# Patient Record
Sex: Female | Born: 1935 | Race: White | Hispanic: No | Marital: Married | Smoking: Never smoker
Health system: Southern US, Community
[De-identification: ages and names within clinical notes are randomized; demographics above are authoritative.]

## PROBLEM LIST (undated history)

## (undated) DIAGNOSIS — E119 Type 2 diabetes mellitus without complications: Secondary | ICD-10-CM

## (undated) DIAGNOSIS — T753XXA Motion sickness, initial encounter: Secondary | ICD-10-CM

## (undated) DIAGNOSIS — E785 Hyperlipidemia, unspecified: Secondary | ICD-10-CM

## (undated) DIAGNOSIS — M1990 Unspecified osteoarthritis, unspecified site: Secondary | ICD-10-CM

## (undated) DIAGNOSIS — H9190 Unspecified hearing loss, unspecified ear: Secondary | ICD-10-CM

## (undated) DIAGNOSIS — E039 Hypothyroidism, unspecified: Secondary | ICD-10-CM

## (undated) DIAGNOSIS — K219 Gastro-esophageal reflux disease without esophagitis: Secondary | ICD-10-CM

## (undated) HISTORY — PX: ROTATOR CUFF REPAIR: SHX139

## (undated) HISTORY — DX: Hyperlipidemia, unspecified: E78.5

---
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
## 1974-07-04 DIAGNOSIS — C50919 Malignant neoplasm of unspecified site of unspecified female breast: Secondary | ICD-10-CM

## 1974-07-04 HISTORY — DX: Malignant neoplasm of unspecified site of unspecified female breast: C50.919

## 1976-07-04 HISTORY — PX: BREAST RECONSTRUCTION: SHX9

## 1976-07-04 HISTORY — PX: MASTECTOMY: SHX3

## 1978-07-04 HISTORY — PX: CYST EXCISION: SHX5701

## 1978-07-04 HISTORY — PX: THYROIDECTOMY: SHX17

## 2005-11-30 ENCOUNTER — Ambulatory Visit: Payer: Self-pay | Admitting: Unknown Physician Specialty

## 2006-03-14 ENCOUNTER — Ambulatory Visit: Payer: Self-pay

## 2008-09-23 ENCOUNTER — Ambulatory Visit: Payer: Self-pay | Admitting: Otolaryngology

## 2009-10-26 ENCOUNTER — Ambulatory Visit: Payer: Self-pay | Admitting: Family Medicine

## 2010-02-07 IMAGING — CT CT NECK WITH CONTRAST
1 of 2 series · 9 of 14 positions shown, 12 images · non-contrast
Comparison: none

REASON FOR EXAM: Left Anterior Fullness
COMMENTS:

[Series 2: soft tissue · axial · 0.45mm/px · z∈[-328,-157]mm · 9 of 73 slices shown, 12 images]
[im 8/73  soft-tissue]
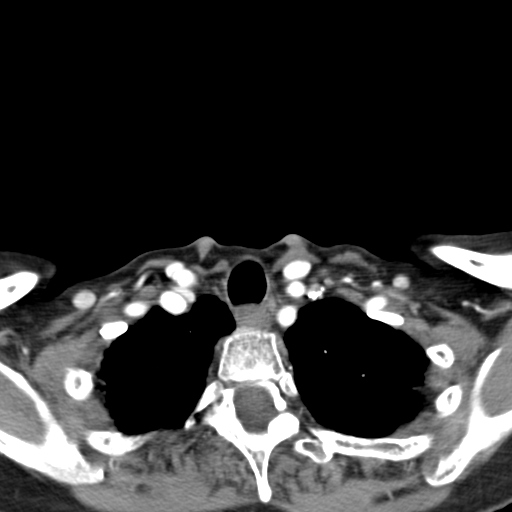
[im 8/73  bone]
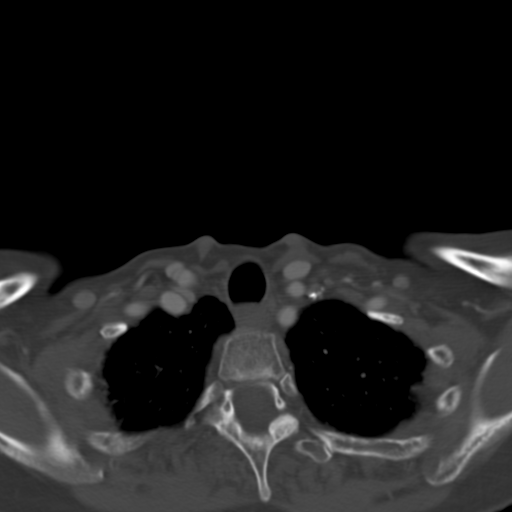
[im 15/73  bone]
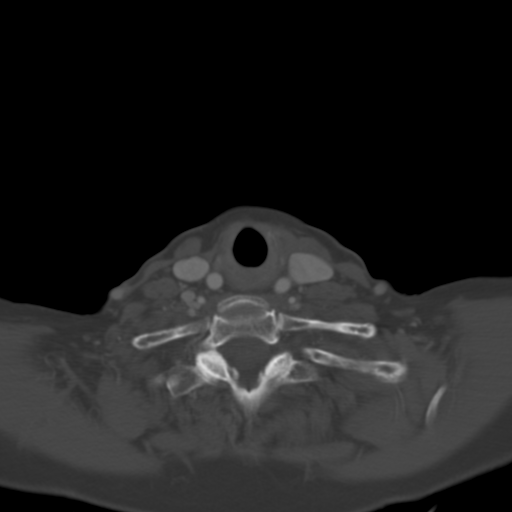
[im 22/73  bone]
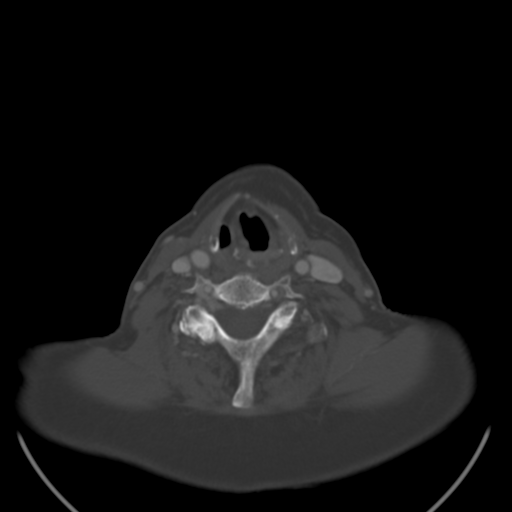
[im 29/73  bone]
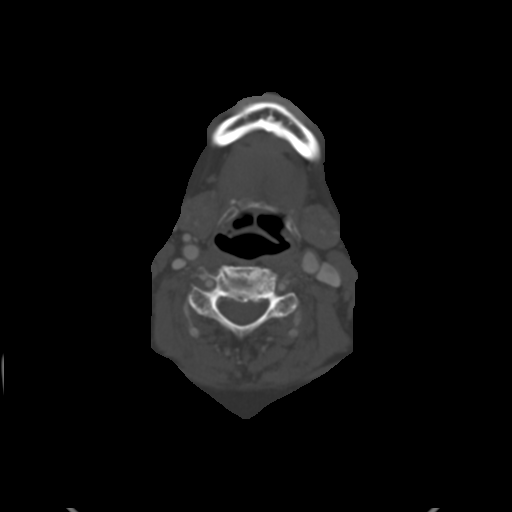
[im 37/73  soft-tissue]
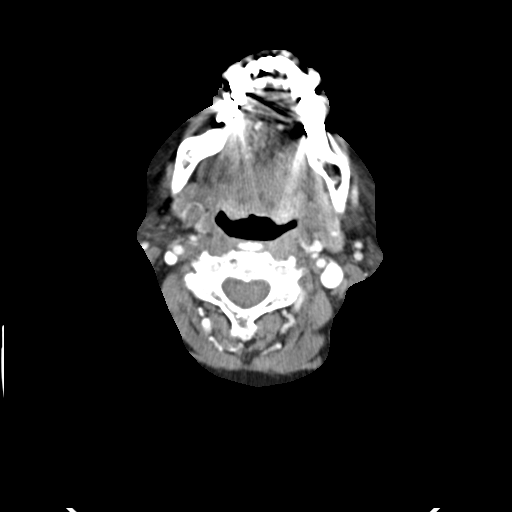
[im 37/73  bone]
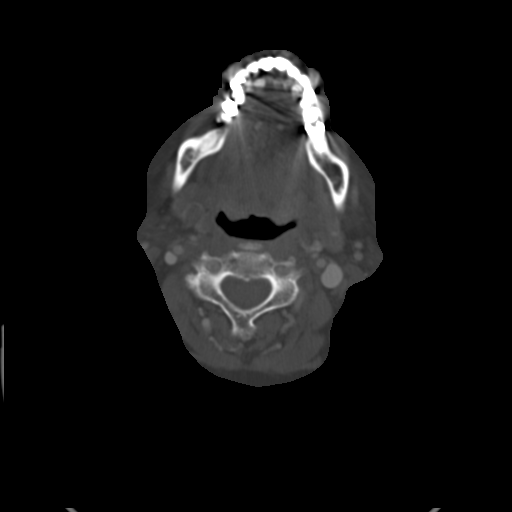
[im 44/73  bone]
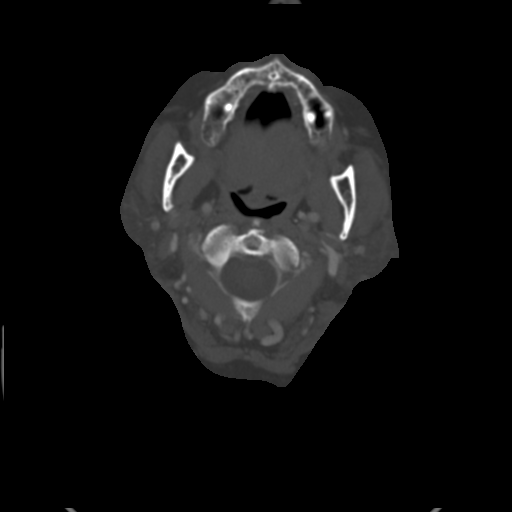
[im 51/73  bone]
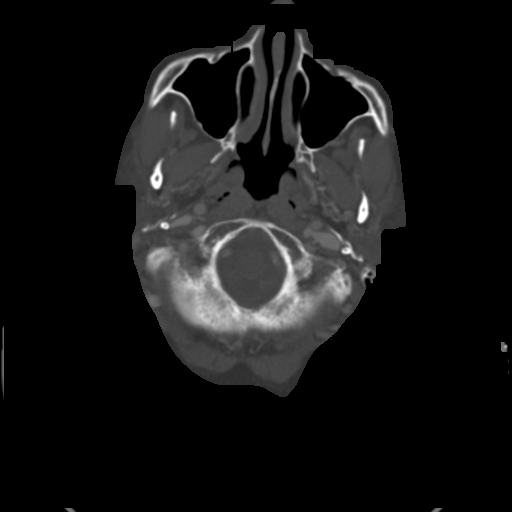
[im 58/73  bone]
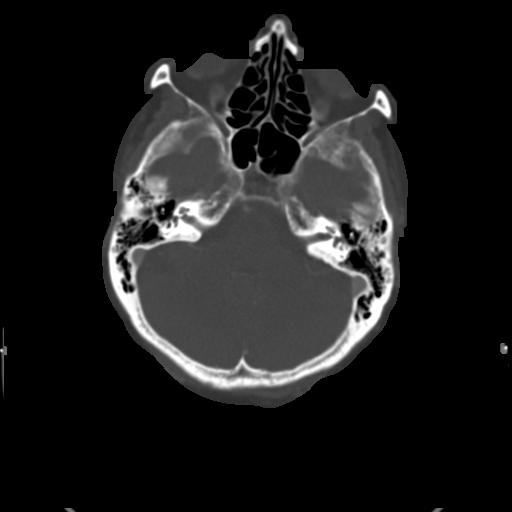
[im 65/73  soft-tissue]
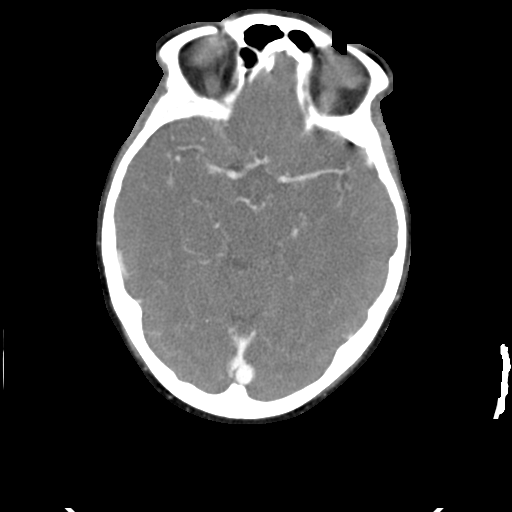
[im 65/73  bone]
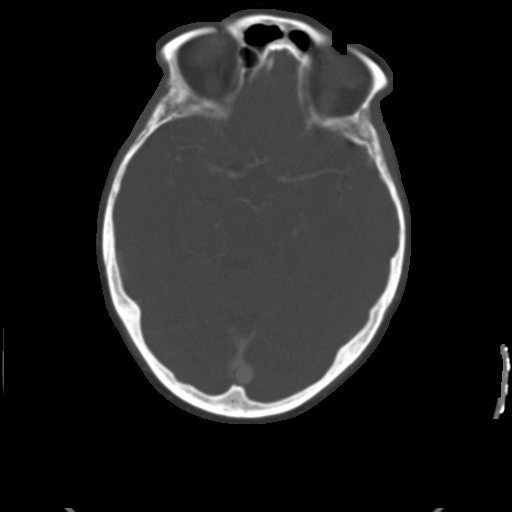

[9 of 14 positions shown; findings below may reference images not displayed]

PROCEDURE:     CT  - CT NECK WITH CONTRAST  - September 23, 2008  [DATE]

RESULT:     Axial CT scanning was performed through the neck at 3 mm
intervals and slice thicknesses following intravenous administration of 70
cc of Qsovue-NIQ. Review of 3-dimensional reconstructed images was performed
separately on the [employer] monitor. The patient is describing
fullness in the anterior aspect of the neck on the left.

The parotid and submandibular glands are normal in density and symmetric in
size. The subcutaneous and deeper soft tissues of the anterior neck appear
normal as well. I see no pathologic sized lymph nodes. On image 40 there is
a subcentimeter lymph node anterior to the right internal jugular vein and
common carotid artery. The sternocleidomastoid musculature appears normal in
density and contour. However, on the left the midportion of the
sternocleidomastoid muscle is slightly more prominent at the level of the
left thyroid bed anterior to the internal jugular vein. This is seen best on
images 59 through 66.

The laryngeal structures exhibit no acute abnormality. The left thyroid lobe
is tiny likely due to prior surgery. On the right the thyroid lobe is more
normal in appearance. The observed portions of the paranasal sinuses are
clear. The oropharyngeal structures and nasopharyngeal structures are within
the limits of normal. There are degenerative disc changes at multiple levels
of the cervical spine. There is mild apical pleural scarring in both upper
lobes.
IMPRESSION: 1. I do not see evidence of a subcutaneous or deeper mass in the anterior
soft tissues of the neck on the left. Very mild asymmetric fullness of the
left sternocleidomastoid muscle as compared to the right sternocleidomastoid
muscle is seen in the mid to lower neck which may be responsible for the
left neck fullness clinically.
2. The parotid and submandibular glands are normal in appearance. I see no
pathologic sized cervical lymphadenopathy.
3. The left thyroid lobe appears atrophic. The right thyroid lobe is more
normal in appearance. No laryngeal abnormalities are identified.

## 2011-02-09 ENCOUNTER — Ambulatory Visit: Payer: Self-pay | Admitting: Unknown Physician Specialty

## 2012-08-07 ENCOUNTER — Ambulatory Visit: Payer: Self-pay | Admitting: Unknown Physician Specialty

## 2012-08-08 LAB — PATHOLOGY REPORT

## 2012-08-10 ENCOUNTER — Ambulatory Visit: Payer: Self-pay | Admitting: Unknown Physician Specialty

## 2013-09-16 LAB — HM COLONOSCOPY

## 2013-12-02 ENCOUNTER — Ambulatory Visit: Payer: Self-pay | Admitting: Orthopedic Surgery

## 2013-12-18 ENCOUNTER — Ambulatory Visit: Payer: Self-pay | Admitting: Orthopedic Surgery

## 2013-12-18 LAB — CBC
HCT: 41.4 % (ref 35.0–47.0)
HGB: 13.7 g/dL (ref 12.0–16.0)
MCH: 32.5 pg (ref 26.0–34.0)
MCHC: 33.2 g/dL (ref 32.0–36.0)
MCV: 98 fL (ref 80–100)
PLATELETS: 232 10*3/uL (ref 150–440)
RBC: 4.23 10*6/uL (ref 3.80–5.20)
RDW: 13.4 % (ref 11.5–14.5)
WBC: 9.7 10*3/uL (ref 3.6–11.0)

## 2013-12-18 LAB — URINALYSIS, COMPLETE
BILIRUBIN, UR: NEGATIVE
Bacteria: NONE SEEN
Blood: NEGATIVE
Glucose,UR: NEGATIVE mg/dL (ref 0–75)
Ketone: NEGATIVE
Leukocyte Esterase: NEGATIVE
Nitrite: NEGATIVE
PROTEIN: NEGATIVE
Ph: 6 (ref 4.5–8.0)
RBC,UR: 1 /HPF (ref 0–5)
Specific Gravity: 1.011 (ref 1.003–1.030)
Squamous Epithelial: NONE SEEN

## 2013-12-18 LAB — BASIC METABOLIC PANEL
Anion Gap: 6 — ABNORMAL LOW (ref 7–16)
BUN: 8 mg/dL (ref 7–18)
CREATININE: 0.67 mg/dL (ref 0.60–1.30)
Calcium, Total: 8.7 mg/dL (ref 8.5–10.1)
Chloride: 104 mmol/L (ref 98–107)
Co2: 30 mmol/L (ref 21–32)
EGFR (African American): >60
EGFR (Non-African Amer.): >60
Glucose: 174 mg/dL — ABNORMAL HIGH (ref 65–99)
Osmolality: 282 (ref 275–301)
Potassium: 3.4 mmol/L — ABNORMAL LOW (ref 3.5–5.1)
Sodium: 140 mmol/L (ref 136–145)

## 2013-12-18 LAB — PROTIME-INR
INR: 1
Prothrombin Time: 12.6 secs (ref 11.5–14.7)

## 2013-12-18 LAB — APTT: ACTIVATED PTT: 26.4 s (ref 23.6–35.9)

## 2013-12-25 IMAGING — RF DG UGI W/ KUB
1 series · 15 of 24 positions shown · non-contrast
Comparison: None

REASON FOR EXAM: reflux mildly asymmetrical hiatal hernia
COMMENTS:

PROCEDURE:     FL  - FL UPPER GI W/ BARIUM SWALLOW  - August 10, 2012  [DATE]
RESULT:     Indication: Hiatal hernia

[Series 1: run · 22 acquisitions, 15 frames shown]
[im 1/22]
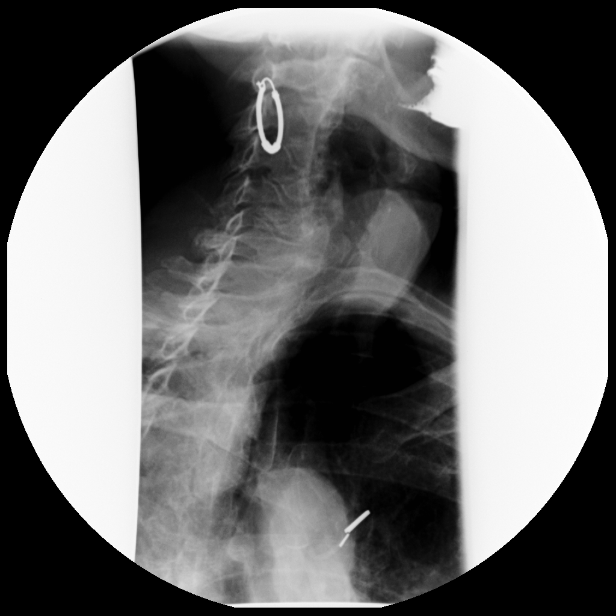
[im 1/22]
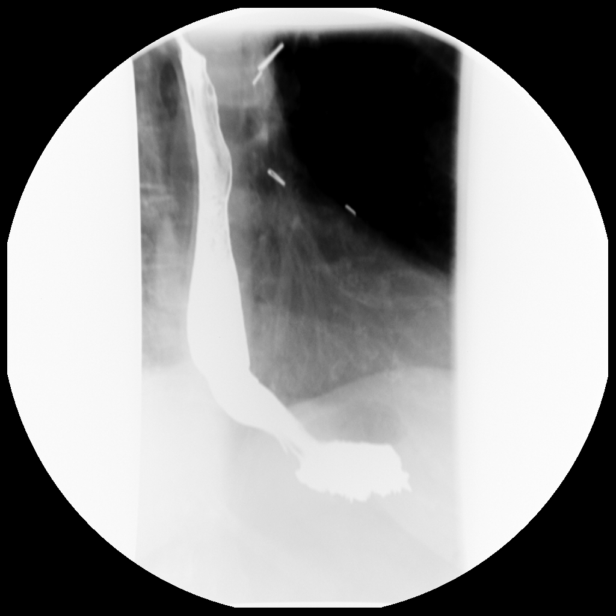
[im 2/22]
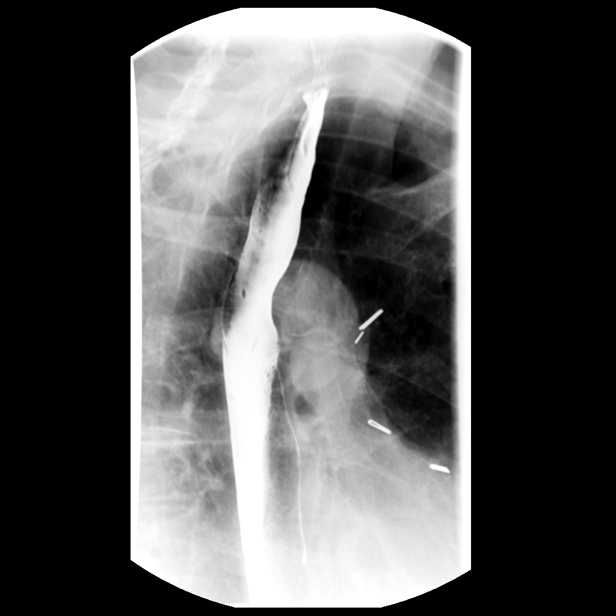
[im 3/22]
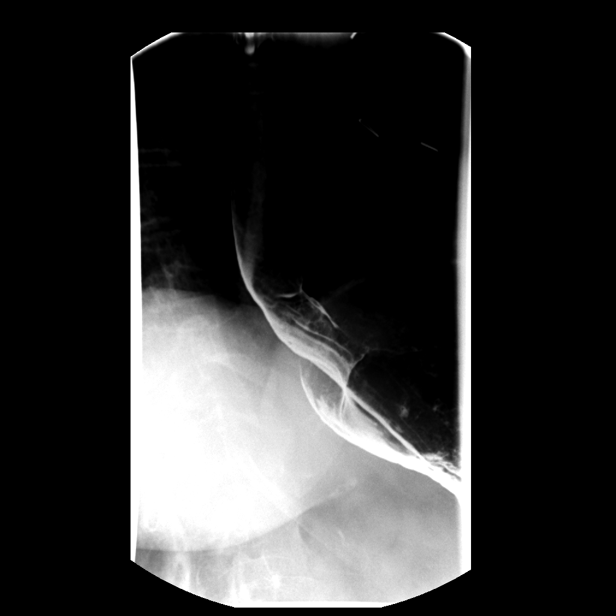
[im 4/22]
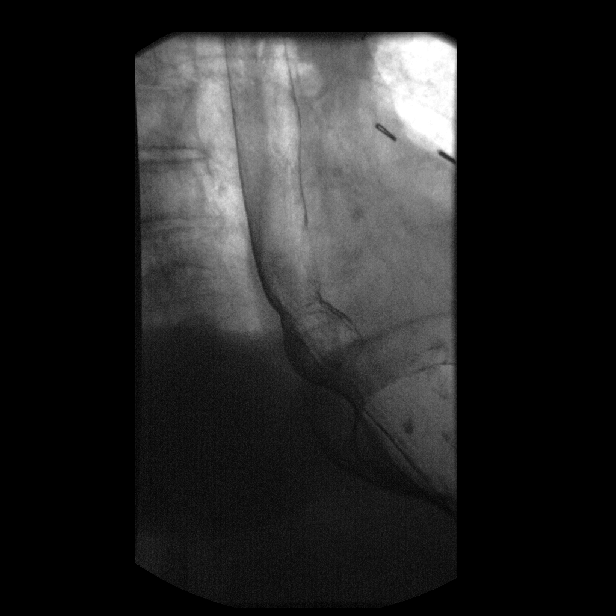
[im 5/22]
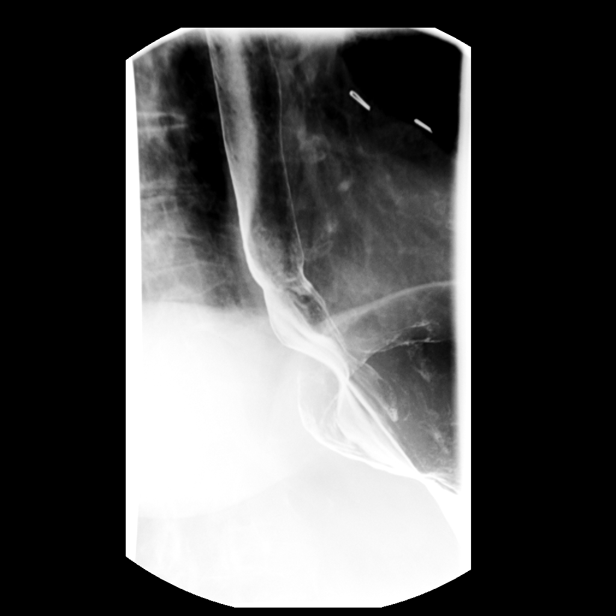
[im 6/22]
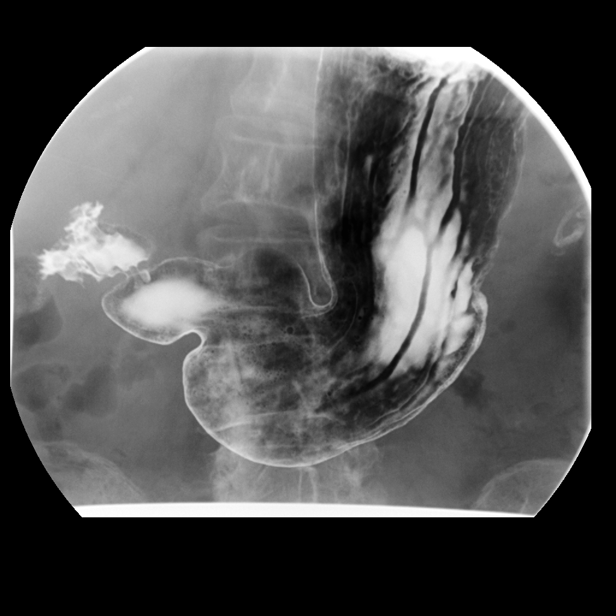
[im 9/22]
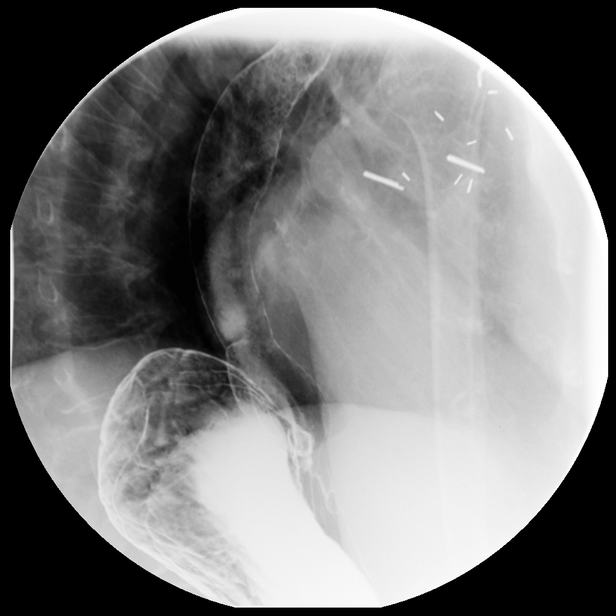
[im 11/22]
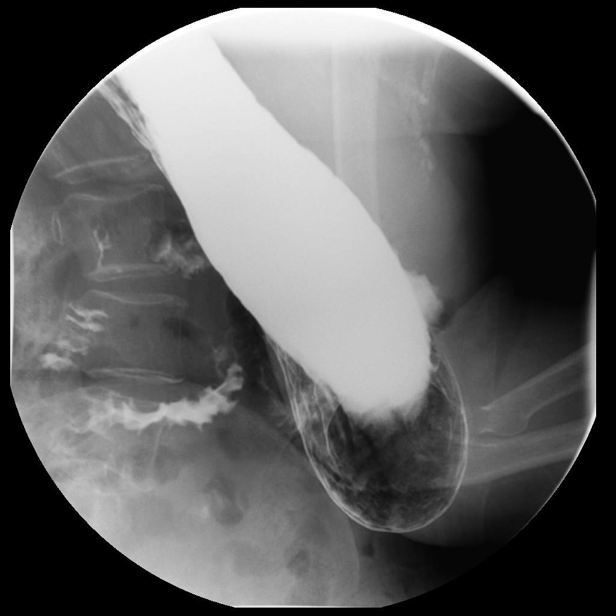
[im 14/22]
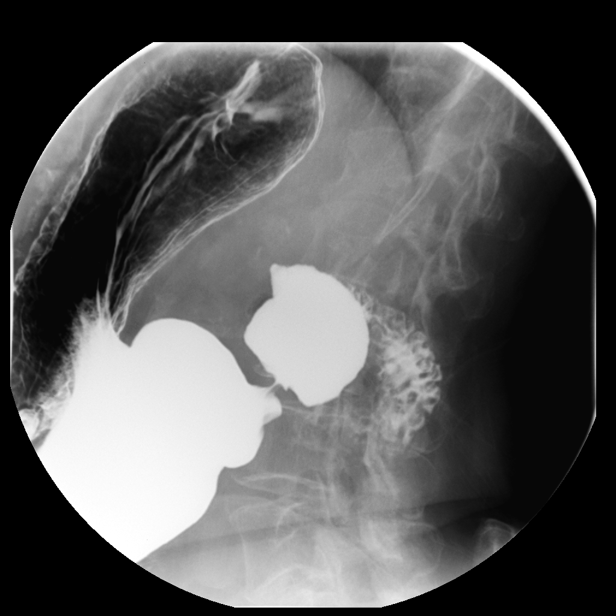
[im 16/22]
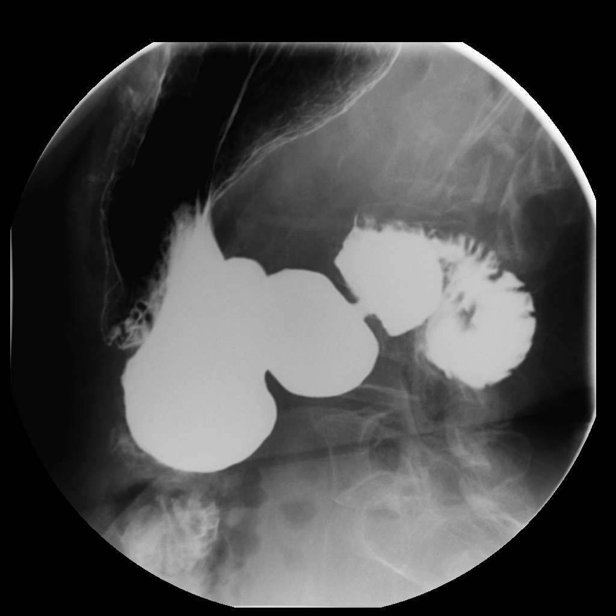
[im 20/22]
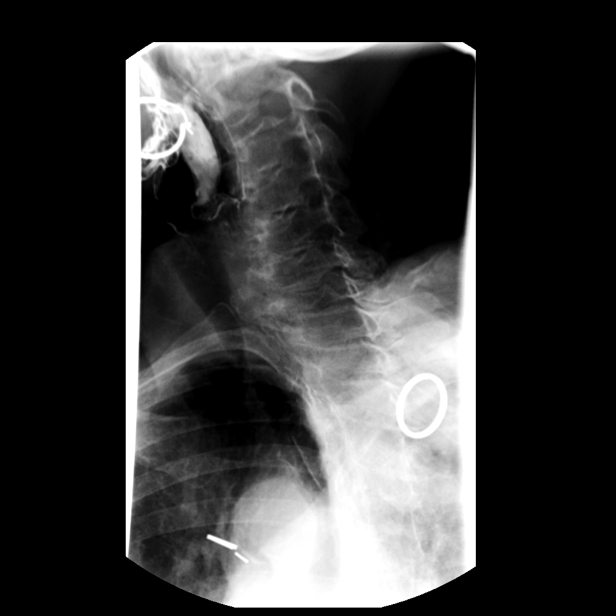
[im 20/22]
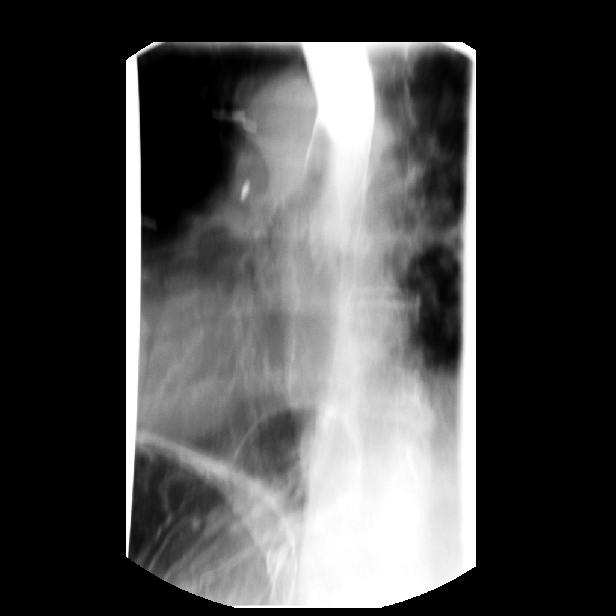
[im 21/22]
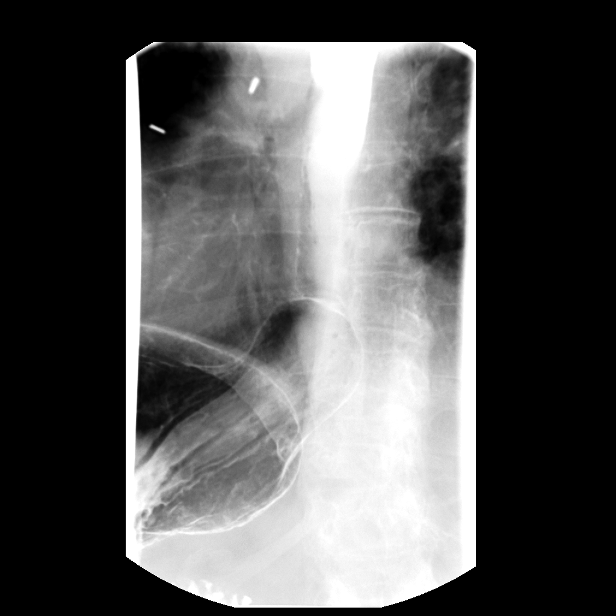
[im 22/22]
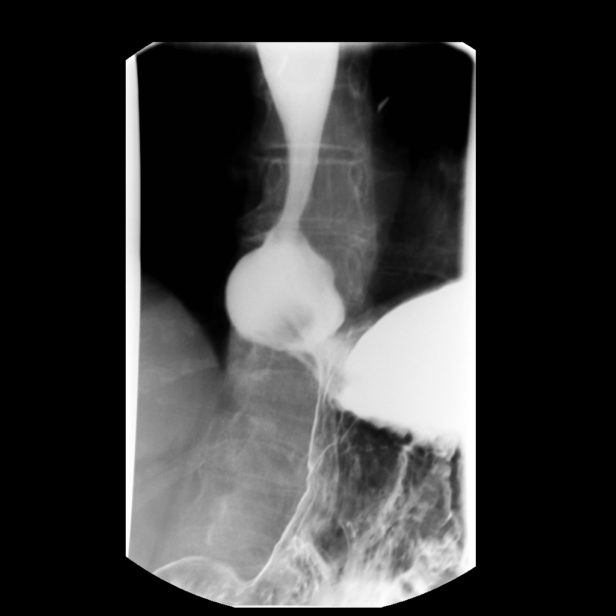

[15 of 24 positions shown; findings below may reference images not displayed]

FINDINGS: Biphasic examination of the esophagus to the distal duodenum was performed
without complication. Total fluoroscopy time was 1.2 minutes.

Examination of the esophagus demonstrated normal esophageal motility. Normal
esophageal morphology without evidence of esophagitis or ulceration. No
esophageal stricture, diverticula, or mass lesion. There is a large hiatal
hernia which reduces in the upright position. There is spontaneous
gastroesophageal reflux.

Examination of the stomach demonstrated normal rugal folds and areae
gastricae. The gastric mucosa appeared unremarkable without evidence of
ulceration, scarring, or mass lesion. Gastric motility and emptying was
normal. Fluoroscopic examination of the duodenum demonstrates normal
motility and morphology without evidence of ulceration or mass lesion.
IMPRESSION: 1. There is a large hiatal hernia which reduces in the upright position.

2. Spontaneous gastroesophageal reflux.

[REDACTED]

## 2014-01-02 ENCOUNTER — Ambulatory Visit: Payer: Self-pay | Admitting: Orthopedic Surgery

## 2014-10-25 NOTE — Op Note (Signed)
PATIENT NAME:  Erin Cook, Erin Cook MR#:  [id number] DATE OF BIRTH:  1936-03-20  DATE OF PROCEDURE:  01/02/2014  PREOPERATIVE DIAGNOSES: Right shoulder rotator cuff tear, subacromial impingement, and acromioclavicular joint arthrosis.   POSTOPERATIVE DIAGNOSES:  Right shoulder rotator cuff tear, subacromial impingement, and acromioclavicular joint arthrosis.  PROCEDURE: Right shoulder arthroscopic subacromial decompression, distal clavicle excision, and mini open rotator cuff repair.   ANESTHESIA: General with right interscalene block with local injection with 1% lidocaine plain and 0.25% Marcaine plain.   SURGEON: [place], M.D.   ASSISTANT: Cameron Proud, [employer] student.   ESTIMATED BLOOD LOSS: Minimal.   COMPLICATIONS: None.   INDICATIONS FOR THE PROCEDURE: Patient is a 79 year old female who has had persistent pain for several months in the right shoulder, particularly with abduction activity. This began after an injury. The patient has not responded to nonoperative management including nonsteroidal anti-inflammatories, corticosteroid injection, and physical therapy. She has an MRI showing a high-grade partial-thickness tear to the supraspinatus. She has opted for surgical repair. I reviewed the risks and benefits of surgery with her prior to the date of her procedure in my office. She understands the risks include, but are not limited to, deep vein thrombosis and pulmonary embolism, myocardial infarction, stroke, pneumonia, respiratory failure, and death. These surgical risks include infection, bleeding, nerve or blood vessel injury, persistent shoulder pain, hardware failure, retear of the rotator cuff, persistent pain or limitation, and the need for further surgery. The patient understood these risks and signed consent form.   PROCEDURE NOTE: The patient was met in the preoperative area. I marked the right shoulder with the word "yes" according to the hospital's right site protocol  after verbally confirming with her that this was the correct site of surgery. She had an interscalene block placed by the anesthesia service in the preoperative area. She was then brought to the Operating Room where she was placed in a beach chair position. A spider arm holder was used for this case. All bony prominences were adequately padded for this case. The patient received 2 grams of Kefzol prior to the onset of the case. She was prepped and draped in a sterile fashion. A timeout was performed to verify the patient's name, date of birth, medical record number, correct site of surgery and correct procedure to be performed. It was also used to verify the patient had received antibiotics and that all appropriate instruments, implants, and radiographic studies were available in the room. Once all in attendance were in agreement, the case began.   The patient had her bony landmarks drawn out with a surgical marker along with proposed arthroscopy incisions. These were pre-injected with 1% lidocaine plain. A posterior incision was made with the 11 blade, through which the arthroscope was placed into the glenohumeral joint. The anterior portal was then established under direct visualization using an 18-gauge spinal needle for localization. A 5.75 mm arthroscopic cannula was placed through the anterior portal.  A full diagnostic examination of the shoulder was undertaken.   Findings on arthroscopy included diffuse fraying of the labrum including the superior, anterior, and posterior labrum. There was no tear of the biceps tendon in the superior labral anchor of the biceps was intact. There was no tear of the subscapularis. There was a high-grade partial-thickness tear in the supraspinatus, which extended into the anterior fibers of the infraspinatus. There was no focal chondral defects of the glenohumeral joint.   A 4.0 mm resector shaver blade was placed through the anterior portal.  Frayed edges of the glenoid  labrum were debrided.  The arthroscope, a lateral portal was accomplished, again using an 18-gauge spinal needle for localization. The 4-0 resector shaver blade was used to debride the torn undersurface of the rotator cuff tear in the anterior portal and then the remaining fibers connected to the greater tuberosity were released using the lateral portal. Two ArthroCare Smart stitches were then placed into the lateral edge of the rotator cuff. The arthroscope was then placed in the subacromial space. A bursectomy was performed using a 4-0 resector shaver blade and a 90 degree ArthroCare wand through the lateral portal. A 5.5 mm resector shaver blade was then placed through the lateral portal and a subacromial decompression was performed. The 5.5 resector shaver blade was then placed through the anterior portal and a distal clavicle excision was performed as well. Final arthroscopic images of the subacromial space were then taken. All arthroscopic instruments were then removed after lavage of the subacromial space.   A saber-type incision along the lateral border of the acromion was then made with a 15 blade. All bleeding vessels were cauterized using electrocautery. The deltoid fascia was then identified. The deltoid was split in line with its fibers. The Smart stitch sutures were then brought out through the deltoid split. A self-retaining shoulder retractor was placed into the deltoid split to allow for visualization of the rotator cuff tear. A 5.5 mm resector shaver blade was used to then bur the greater tuberosity to allow for punctate bleeding. All tears of the rotator cuff were debrided from the greater tuberosity to allow for better repair of the supraspinatus.   Two ArthroCare Magnum M anchors were placed at the articular margin of the humeral head. One suture from each of these anchors was placed at the medial rotator cuff for later medial row repair. A third ArthroCare Smart stitch was placed in the  lateral border of the rotator cuff. The Smart stitches of the lateral edge then each fed through a single Magnum two anchor, which was then placed over the lateral edge of the greater tuberosity through a small drill hole. These were then tensioned to allow for the rotator cuff to be reduced to its original footprint. Of the medial Magnum M anchor sutures were then tied down with an arthroscopic knot-tying device. Once the double row fixation was completed, the arm was rotated and the rotator cuff moved as a single unit. There were no dog ears or irregularity to the cuff. It was sitting flat against the greater tuberosity. This wound was feeling copiously irrigated. External arthroscopic images were taken of the rotator cuff repair. The scope was then placed back through the posterior portal into the glenohumeral joint and arthroscopic images of the rotator cuff were taken. It was also confirmed that the biceps tendon had not been snared in the rotator cuff repair sutures. This was confirmed arthroscopically.   Of note, an examination under anesthesia had been performed prior to incisions being made in the right shoulder. The patient had full range of motion and no instability to load and shift testing both in the anterior and posterior direction. There was no sulcus sign.   Once the wound was copiously irrigated, the self-retaining retractor was removed along with all arthroscopic instruments. The deltoid fascia was closed with an interrupted 0 Vicryl. The subcutaneous tissue of the saber incision was closed with 2-0 Vicryl and the skin was approximated with a running 4-0 undyed Monocryl. The 3 arthroscopy portals were closed  with interrupted 4-0 nylon. All wounds had Steri-Strips applied to them along with a dry sterile dressing. TENS unit pads and a Polar care sleeve were also placed along with a dry sterile dressing. The patient's right arm was placed in an abduction sling. She was awoken and transferred to  a hospital stretcher and brought to the PACU in stable condition. I was scrubbed and present for the entire case, and all sharp and instrument counts were correct at the conclusion of the case. I spoke with the patient's husband postoperatively to let him know the case had gone without complication. The patient was stable in the recovery room.   ____________________________ Timoteo Gaul, MD klk:ts D: 01/09/2014 12:35:13 ET T: 01/09/2014 13:17:34 ET JOB#: [id number]  cc: Timoteo Gaul, MD, <Dictator> Timoteo Gaul MD ELECTRONICALLY SIGNED 01/10/2014 16:56

## 2015-02-10 ENCOUNTER — Other Ambulatory Visit: Payer: Self-pay

## 2015-02-10 ENCOUNTER — Ambulatory Visit (INDEPENDENT_AMBULATORY_CARE_PROVIDER_SITE_OTHER): Payer: PPO | Admitting: Family Medicine

## 2015-02-10 ENCOUNTER — Encounter: Payer: Self-pay | Admitting: Family Medicine

## 2015-02-10 VITALS — BP 134/78 | HR 84 | Temp 98.4°F | Resp 16 | Wt 127.4 lb

## 2015-02-10 DIAGNOSIS — K449 Diaphragmatic hernia without obstruction or gangrene: Secondary | ICD-10-CM | POA: Insufficient documentation

## 2015-02-10 DIAGNOSIS — K2270 Barrett's esophagus without dysplasia: Secondary | ICD-10-CM | POA: Insufficient documentation

## 2015-02-10 DIAGNOSIS — E7800 Pure hypercholesterolemia, unspecified: Secondary | ICD-10-CM | POA: Insufficient documentation

## 2015-02-10 DIAGNOSIS — E038 Other specified hypothyroidism: Secondary | ICD-10-CM | POA: Insufficient documentation

## 2015-02-10 DIAGNOSIS — E119 Type 2 diabetes mellitus without complications: Secondary | ICD-10-CM | POA: Insufficient documentation

## 2015-02-10 DIAGNOSIS — E039 Hypothyroidism, unspecified: Secondary | ICD-10-CM | POA: Insufficient documentation

## 2015-02-10 DIAGNOSIS — E890 Postprocedural hypothyroidism: Secondary | ICD-10-CM | POA: Diagnosis not present

## 2015-02-10 DIAGNOSIS — K219 Gastro-esophageal reflux disease without esophagitis: Secondary | ICD-10-CM | POA: Insufficient documentation

## 2015-02-10 DIAGNOSIS — R202 Paresthesia of skin: Secondary | ICD-10-CM | POA: Insufficient documentation

## 2015-02-10 DIAGNOSIS — Z853 Personal history of malignant neoplasm of breast: Secondary | ICD-10-CM | POA: Insufficient documentation

## 2015-02-10 DIAGNOSIS — M1990 Unspecified osteoarthritis, unspecified site: Secondary | ICD-10-CM | POA: Insufficient documentation

## 2015-02-10 LAB — POCT UA - MICROALBUMIN: Microalbumin Ur, POC: NEGATIVE mg/L

## 2015-02-10 MED ORDER — LEVOTHYROXINE SODIUM 88 MCG PO TABS: 88.0000 ug | ORAL_TABLET | Freq: Every day | ORAL | Status: DC

## 2015-02-10 MED ORDER — GLUCOSE BLOOD VI STRP: 1.0000 | ORAL_STRIP | Status: DC | PRN

## 2015-02-10 MED ORDER — SIMVASTATIN 10 MG PO TABS: 10.0000 mg | ORAL_TABLET | Freq: Every day | ORAL | Status: DC

## 2015-02-10 NOTE — Progress Notes (Signed)
Patient ID: Erin Cook, female   DOB: 11/14/1935, 79 y.o.   MRN: [id number]       Patient: Erin Cook Female    DOB: 1936/01/17   79 y.o.   MRN: [id number] Visit Date: 02/10/2015  Today's Provider: Margarita Rana, MD   Chief Complaint  Patient presents with  . Diabetes    follow-up, labs done on 12/18/2013   Subjective:    Diabetes She presents for her follow-up diabetic visit. She has type 2 diabetes mellitus. Her disease course has been stable. Pertinent negatives for hypoglycemia include no confusion, dizziness, headaches, hunger, sweats or tremors. Pertinent negatives for diabetes include no blurred vision, no chest pain, no fatigue, no foot paresthesias, no foot ulcerations, no weakness and no weight loss. Pertinent negatives for hypoglycemia complications include no blackouts. Current diabetic treatment includes diet (no sweets). Her breakfast blood glucose is taken between 9-10 am. Her breakfast blood glucose range is generally 140-180 mg/dl. (Post breakfast)     Pt is requesting the simvastatin 10 mg due to joint pain side effects.  Has has some weight loss from narcotics. Is gaining weight back now.   Patient Active Problem List   Diagnosis Date Noted  . Acid reflux 02/10/2015  . Barrett esophagus 02/10/2015  . Breast CA 02/10/2015  . Arthritis, degenerative 02/10/2015  . 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 Hypercholesteremia 02/10/2015  . Burning or prickling sensation 02/10/2015  . Hypothyroidism, postop 02/10/2015  . Diabetes mellitus, type 2 02/10/2015   No past medical history on file. No current outpatient prescriptions on file prior to visit.   No current facility-administered medications on file prior to visit.   Not on File Past Surgical History  Procedure Laterality Date  . Thyroidectomy  1980  . Mastectomy Bilateral 1978  . Breast reconstruction Bilateral 1978  . Tonsillectomy  1945  . Rotator cuff repair Right     [facility].     . Cyst excision  1980    In her chest.    History   Social History  . Marital Status: Married    Spouse Name: N/A  . Number of Children: 1  . Years of Education: H/S   Occupational History  . Retired   . 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 Smoking status: Never Smoker   . Smokeless tobacco: Never Used  . Alcohol Use: No  . Drug Use: No  . Sexual Activity: Not on file   Other Topics Concern  . Not on file   Social History Narrative   Family History  Problem Relation Age of Onset  . Stroke Mother   . Stroke Father        Not on File Previous Medications   GLUCOSE BLOOD (BAYER CONTOUR TEST) TEST STRIP    BAYER CONTOUR TEST (In Vitro Strip)  1 (one) Strip To check blood sugar once a day for 0 days  Quantity: 100;  Refills: 3   Ordered :15-May-2014  Margarita Rana MD;  Started 15-May-2014 Active Comments: Non-insulin dependent diabetes.   LEVOTHYROXINE (SYNTHROID, LEVOTHROID) 88 MCG TABLET    Take by mouth.   OMEPRAZOLE (PRILOSEC) 20 MG CAPSULE    Take by mouth.   SIMVASTATIN (ZOCOR) 20 MG TABLET    Take by mouth.    Review of Systems  Constitutional: Negative for weight loss and fatigue.  Eyes: Negative for blurred vision.  Cardiovascular: Negative for chest pain.  Neurological: Negative for dizziness,  tremors, weakness and headaches.  Psychiatric/Behavioral: Negative for confusion.    History  Substance Use Topics  . Smoking status: Never Smoker   . Smokeless tobacco: Never Used  . Alcohol Use: No   Objective:   BP 134/78 mmHg  Pulse 84  Temp(Src) 98.4 F (36.9 C) (Oral)  Resp 16  Wt 127 lb 6.4 oz (57.788 kg)  Physical Exam  Constitutional: She is oriented to person, place, and time. She appears well-developed and well-nourished.  Cardiovascular: Normal rate and regular rhythm.   Pulmonary/Chest: Effort normal and breath sounds normal.  Neurological: She is alert and oriented to person, place, and time.       Assessment & Plan:     1. Hypothyroidism, postop Condition is stable. Please continue current medication and  plan of care as noted.   - levothyroxine (SYNTHROID, LEVOTHROID) 88 MCG tablet; Take 1 tablet (88 mcg total) by mouth daily before breakfast.  Dispense: 90 tablet; Refill: 3 - TSH  2. Type 2 diabetes mellitus without complication Will check labs.  Patient concerned may be up.  Lost too much weight after surgery, but is gaining some weight back.  - glucose blood (BAYER CONTOUR TEST) test strip; 1 each by Other route as needed for other. Use as instructed  Dispense: 100 each; Refill: 3 - POCT UA - Microalbumin  3. Hypercholesteremia Will decrease medication secondary to side effects.  Will check labs.   - Lipid Panel With LDL/HDL Ratio - Comprehensive metabolic panel - Hemoglobin A1c - simvastatin (ZOCOR) 10 MG tablet; Take 1 tablet (10 mg total) by mouth at bedtime.  Dispense: 90 tablet; Refill: [place], MD  [facility]

## 2015-02-12 ENCOUNTER — Other Ambulatory Visit: Payer: Self-pay | Admitting: Family Medicine

## 2015-02-12 DIAGNOSIS — E119 Type 2 diabetes mellitus without complications: Secondary | ICD-10-CM

## 2015-02-12 LAB — COMPREHENSIVE METABOLIC PANEL
ALK PHOS: 66 IU/L (ref 39–117)
ALT: 16 IU/L (ref 0–32)
AST: 22 IU/L (ref 0–40)
Albumin/Globulin Ratio: 2.2 (ref 1.1–2.5)
Albumin: 4.2 g/dL (ref 3.5–4.8)
BILIRUBIN TOTAL: 0.9 mg/dL (ref 0.0–1.2)
BUN/Creatinine Ratio: 17 (ref 11–26)
BUN: 12 mg/dL (ref 8–27)
CO2: 25 mmol/L (ref 18–29)
Calcium: 9.4 mg/dL (ref 8.7–10.3)
Chloride: 105 mmol/L (ref 97–108)
Creatinine, Ser: 0.7 mg/dL (ref 0.57–1.00)
GFR calc Af Amer: 97 mL/min/{1.73_m2} (ref >59)
GFR calc non Af Amer: 84 mL/min/{1.73_m2} (ref >59)
Globulin, Total: 1.9 g/dL (ref 1.5–4.5)
Glucose: 142 mg/dL — ABNORMAL HIGH (ref 65–99)
Potassium: 4.2 mmol/L (ref 3.5–5.2)
Sodium: 143 mmol/L (ref 134–144)
TOTAL PROTEIN: 6.1 g/dL (ref 6.0–8.5)

## 2015-02-12 LAB — LIPID PANEL WITH LDL/HDL RATIO
Cholesterol, Total: 181 mg/dL (ref 100–199)
HDL: 65 mg/dL (ref >39)
LDL Calculated: 99 mg/dL (ref 0–99)
LDL/HDL RATIO: 1.5 ratio (ref 0.0–3.2)
Triglycerides: 83 mg/dL (ref 0–149)
VLDL CHOLESTEROL CAL: 17 mg/dL (ref 5–40)

## 2015-02-12 LAB — HEMOGLOBIN A1C
ESTIMATED AVERAGE GLUCOSE: 148 mg/dL
HEMOGLOBIN A1C: 6.8 % — AB (ref 4.8–5.6)

## 2015-02-12 LAB — TSH: TSH: 0.169 u[IU]/mL — ABNORMAL LOW (ref 0.450–4.500)

## 2015-02-13 ENCOUNTER — Other Ambulatory Visit: Payer: Self-pay

## 2015-02-13 ENCOUNTER — Telehealth: Payer: Self-pay

## 2015-02-13 DIAGNOSIS — E119 Type 2 diabetes mellitus without complications: Secondary | ICD-10-CM

## 2015-02-13 MED ORDER — FREESTYLE SYSTEM KIT: 1.0000 | PACK | Status: DC | PRN

## 2015-02-13 NOTE — Telephone Encounter (Signed)
-----   Message from Margarita Rana, MD sent at 02/12/2015  8:42 PM EDT ----- Blood sugar about the same.  Continue to eat healthy and exercise. Recheck in 3 months for stability.  Thyroid is overcorrected.  Would recommend decrease thyroid medication to 75 mcg and recheck TSH in 6 weeks. Thanks.

## 2015-02-13 NOTE — Telephone Encounter (Signed)
Last ov 05/12/14 A1c 6.9% on 05/15/14

## 2015-02-13 NOTE — Telephone Encounter (Signed)
Pt advised; she says she does not want to decrease her thyroid medication.  She says it makes her feel very fatigued when she tries a lower dose.  It is okay if she continues with the current dose.     Thanks,   -Mickel Baas

## 2015-02-13 NOTE — Telephone Encounter (Signed)
LMTCB 02/13/2015  Thanks,   -Mickel Baas

## 2015-05-20 ENCOUNTER — Encounter: Payer: PPO | Admitting: Family Medicine

## 2015-08-04 ENCOUNTER — Encounter: Payer: Self-pay | Admitting: Family Medicine

## 2015-08-04 ENCOUNTER — Ambulatory Visit (INDEPENDENT_AMBULATORY_CARE_PROVIDER_SITE_OTHER): Payer: PPO | Admitting: Family Medicine

## 2015-08-04 VITALS — BP 117/70 | HR 96 | Temp 97.6°F | Resp 16 | Ht 61.25 in | Wt 134.0 lb

## 2015-08-04 DIAGNOSIS — E119 Type 2 diabetes mellitus without complications: Secondary | ICD-10-CM

## 2015-08-04 DIAGNOSIS — Z23 Encounter for immunization: Secondary | ICD-10-CM | POA: Diagnosis not present

## 2015-08-04 DIAGNOSIS — E890 Postprocedural hypothyroidism: Secondary | ICD-10-CM

## 2015-08-04 DIAGNOSIS — Z0000 Encounter for general adult medical examination without abnormal findings: Secondary | ICD-10-CM

## 2015-08-04 NOTE — Progress Notes (Signed)
Patient ID: ANWAR CRILL, female   DOB: 1936-06-02, 80 y.o.   MRN: [id number]       Patient: Erin Cook, Female    DOB: 17-Mar-1936, 80 y.o.   MRN: [id number] Visit Date: 08/04/2015  Today's Provider: Margarita Rana, MD   Chief Complaint  Patient presents with  . Medicare Wellness   Subjective:    Annual wellness visit Erin Cook is a 80 y.o. female. She feels well. She reports exercising 3 days a week (2 miles). She reports she is sleeping fairly well. 02/09/11 Colon-diverticulosis, int hemorrhoids, recheck in 5 yrs 04/27/05 BMD-osteopenia  Lab Results  Component Value Date   WBC 9.7 12/18/2013   HGB 13.7 12/18/2013   HCT 41.4 12/18/2013   PLT 232 12/18/2013   GLUCOSE 142* 02/11/2015   CHOL 181 02/11/2015   TRIG 83 02/11/2015   HDL 65 02/11/2015   LDLCALC 99 02/11/2015   ALT 16 02/11/2015   AST 22 02/11/2015   NA 143 02/11/2015   K 4.2 02/11/2015   CL 105 02/11/2015   CREATININE 0.70 02/11/2015   BUN 12 02/11/2015   CO2 25 02/11/2015   TSH 0.169* 02/11/2015   INR 1.0 12/18/2013   HGBA1C 6.8* 02/11/2015   ------------------------------------------------------------------------------------------------------------------------   Review of Systems  Constitutional: Negative.   HENT: Negative.   Eyes: Negative.   Respiratory: Negative.   Cardiovascular: Negative.   Gastrointestinal: Negative.   Endocrine: Negative.   Genitourinary: Negative.   Musculoskeletal: Negative.   Skin: Negative.   Allergic/Immunologic: Negative.   Neurological: Negative.   Hematological: Negative.   Psychiatric/Behavioral: Negative.     Social History   Social History  . Marital Status: Married    Spouse Name: N/A  . Number of Children: 1  . Years of Education: H/S   Occupational History  . Retired   . 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 Smoking status: Never Smoker   . Smokeless tobacco: Never Used  . Alcohol Use: No  .  Drug Use: No  . Sexual Activity: Not on file   Other Topics Concern  . Not on file   Social History Narrative    Past Medical History  Diagnosis Date  . Hyperlipidemia      Patient Active Problem List   Diagnosis Date Noted  . Acid reflux 02/10/2015  . Barrett esophagus 02/10/2015  . Breast CA ([place]) 02/10/2015  . Arthritis, degenerative 02/10/2015  . 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 Hypercholesteremia 02/10/2015  . Burning or prickling sensation 02/10/2015  . Hypothyroidism, postop 02/10/2015  . Diabetes mellitus, type 2 ([place]) 02/10/2015    Past Surgical History  Procedure Laterality Date  . Thyroidectomy  1980  . Mastectomy Bilateral 1978  . Breast reconstruction Bilateral 1978  . Tonsillectomy  1945  . Rotator cuff repair Right July, 2015, Jan, 2016    Dr. Lisette Grinder.  Dr.  Amedeo Plenty.   Marland Kitchen Cyst excision  1980    In her chest.     Her family history includes Stroke in her father and mother.    Previous Medications   BAYER CONTOUR TEST TEST STRIP    USE AS DIRECTED TO TEST BLOOD SUGAR ONCE DAILY   GLUCOSE MONITORING KIT (FREESTYLE) MONITORING KIT    1 each by Does not apply route as needed for other.   LEVOTHYROXINE (SYNTHROID, LEVOTHROID) 88 MCG TABLET    Take 1 tablet (88 mcg total) by mouth daily before breakfast.  OMEPRAZOLE (PRILOSEC) 20 MG CAPSULE    Take by mouth.   SIMVASTATIN (ZOCOR) 10 MG TABLET    Take 1 tablet (10 mg total) by mouth at bedtime.    Patient Care Team: Margarita Rana, MD as PCP - General (Family Medicine)     Objective:   Vitals: BP 117/70 mmHg  Pulse 96  Temp(Src) 97.6 F (36.4 C) (Oral)  Resp 16  Ht 5' 1.25" (1.556 m)  Wt 134 lb (60.782 kg)  BMI 25.10 kg/m2  SpO2 99%  Physical Exam  Constitutional: She is oriented to person, place, and time. She appears well-developed and well-nourished.  HENT:  Head: Normocephalic and atraumatic.  Right Ear: Tympanic membrane, external ear and ear canal normal.  Left Ear: Tympanic  membrane, external ear and ear canal normal.  Nose: Nose normal.  Mouth/Throat: Uvula is midline, oropharynx is clear and moist and mucous membranes are normal.  Eyes: Conjunctivae, EOM and lids are normal. Pupils are equal, round, and reactive to light.  Neck: Trachea normal and normal range of motion. Neck supple. Carotid bruit is not present. No thyroid mass and no thyromegaly present.  Cardiovascular: Normal rate, regular rhythm and normal heart sounds.   Pulmonary/Chest: Effort normal and breath sounds normal.  Abdominal: Soft. Normal appearance and bowel sounds are normal. There is no hepatosplenomegaly. There is no tenderness.  Musculoskeletal: Normal range of motion.  Lymphadenopathy:    She has no cervical adenopathy.    She has no axillary adenopathy.  Neurological: She is alert and oriented to person, place, and time. She has normal strength. No cranial nerve deficit.  Skin: Skin is warm, dry and intact.  Psychiatric: She has a normal mood and affect. Her speech is normal and behavior is normal. Judgment and thought content normal. Cognition and memory are normal.    Activities of Daily Living In your present state of health, do you have any difficulty performing the following activities: 08/04/2015  Hearing? N  Vision? N  Difficulty concentrating or making decisions? N  Walking or climbing stairs? N  Dressing or bathing? N  Doing errands, shopping? N    Fall Risk Assessment Fall Risk  08/04/2015 02/10/2015  Falls in the past year? No Yes  Number falls in past yr: - 1  Injury with Fall? - Yes  Risk Factor Category  - High Fall Risk     Depression Screen PHQ 2/9 Scores 08/04/2015 02/10/2015  PHQ - 2 Score 0 0  Exception Documentation - Medical reason    Cognitive Testing - 6-CIT  Correct? Score   What year is it? yes 0 0 or 4  What month is it? yes 0 0 or 3  Memorize:    [place], [place],  42,  [place],      What time is it? (within 1 hour) yes 0 0 or 3    Count backwards from 20 yes 0 0, 2, or 4  Name the months of the year yes 0 0, 2, or 4  Repeat name & address above no 2 0, 2, 4, 6, 8, or 10       TOTAL SCORE  2/28   Interpretation:  Normal  Normal (0-7) Abnormal (8-28)       Assessment & Plan:     Annual Wellness Visit  Reviewed patient's Family Medical History Reviewed and updated list of patient's medical providers Assessment of cognitive impairment was done Assessed patient's functional ability Established a written schedule for health screening  services Health Risk Assessent Completed and Reviewed  Exercise Activities and Dietary recommendations Goals    None      Immunization History  Administered Date(s) Administered  . Influenza-Unspecified 03/10/2015  . Tdap 03/17/2006  . Zoster 08/22/2012        1. Medicare annual wellness visit, subsequent Stable. Patient advised to continue eating healthy and exercise daily. Recently loss sister in car accident. Still in early stages of grief process. Will call if any difficulties develop.     2. Need for pneumococcal vaccination - Pneumococcal conjugate vaccine 13-valent IM  3. Hypothyroidism, postop - TSH  4. Diabetes mellitus without complication ([place]) - Hemoglobin A1c     Patient seen and examined by Dr. Jerrell Belfast, and note scribed by Philbert Riser. Dimas, CMA. I have reviewed the document for accuracy and completeness and I agree with above. Jerrell Belfast, MD   Margarita Rana, MD    ------------------------------------------------------------------------------------------------------------

## 2015-08-20 DIAGNOSIS — E119 Type 2 diabetes mellitus without complications: Secondary | ICD-10-CM | POA: Diagnosis not present

## 2015-08-20 DIAGNOSIS — E890 Postprocedural hypothyroidism: Secondary | ICD-10-CM | POA: Diagnosis not present

## 2015-08-21 ENCOUNTER — Telehealth: Payer: Self-pay

## 2015-08-21 LAB — TSH: TSH: 0.347 u[IU]/mL — ABNORMAL LOW (ref 0.450–4.500)

## 2015-08-21 LAB — HEMOGLOBIN A1C
ESTIMATED AVERAGE GLUCOSE: 163 mg/dL
Hgb A1c MFr Bld: 7.3 % — ABNORMAL HIGH (ref 4.8–5.6)

## 2015-08-21 NOTE — Telephone Encounter (Signed)
LMTCB and changed dx. Renaldo Fiddler, CMA

## 2015-08-21 NOTE — Telephone Encounter (Signed)
-----   Message from Margarita Rana, MD sent at 08/21/2015  7:08 AM EST ----- Blood sugar is elevated higher than previous. Eat healthy and exercise and recheck in 3 months for stability. Also, thyroid is over active.  Please have patient repeat complete thyroid panel in 3 months, not just TSH and change diagnosis to subclinical hyperthyroidism . Thanks.

## 2015-08-24 NOTE — Telephone Encounter (Signed)
Advised pt of lab results. Pt verbally acknowledges understanding. Burlon Centrella Drozdowski, CMA   

## 2015-09-17 ENCOUNTER — Encounter: Payer: Self-pay | Admitting: Internal Medicine

## 2015-09-17 ENCOUNTER — Encounter (INDEPENDENT_AMBULATORY_CARE_PROVIDER_SITE_OTHER): Payer: Self-pay

## 2015-09-17 ENCOUNTER — Ambulatory Visit (INDEPENDENT_AMBULATORY_CARE_PROVIDER_SITE_OTHER): Payer: PPO | Admitting: Internal Medicine

## 2015-09-17 VITALS — BP 143/68 | HR 69 | Temp 97.8°F | Ht 62.0 in | Wt 132.4 lb

## 2015-09-17 DIAGNOSIS — E7800 Pure hypercholesterolemia, unspecified: Secondary | ICD-10-CM

## 2015-09-17 DIAGNOSIS — C50912 Malignant neoplasm of unspecified site of left female breast: Secondary | ICD-10-CM

## 2015-09-17 DIAGNOSIS — E890 Postprocedural hypothyroidism: Secondary | ICD-10-CM | POA: Diagnosis not present

## 2015-09-17 DIAGNOSIS — I890 Lymphedema, not elsewhere classified: Secondary | ICD-10-CM | POA: Insufficient documentation

## 2015-09-17 DIAGNOSIS — E119 Type 2 diabetes mellitus without complications: Secondary | ICD-10-CM | POA: Diagnosis not present

## 2015-09-17 DIAGNOSIS — C50911 Malignant neoplasm of unspecified site of right female breast: Secondary | ICD-10-CM | POA: Diagnosis not present

## 2015-09-17 LAB — HM MAMMOGRAPHY

## 2015-09-17 MED ORDER — METFORMIN HCL 500 MG PO TABS: 500.0000 mg | ORAL_TABLET | Freq: Two times a day (BID) | ORAL | Status: DC

## 2015-09-17 MED ORDER — LEVOTHYROXINE SODIUM 75 MCG PO TABS: 75.0000 ug | ORAL_TABLET | Freq: Every day | ORAL | Status: DC

## 2015-09-17 NOTE — Assessment & Plan Note (Signed)
Continue simvastatin. Will check lipids with next labs.

## 2015-09-17 NOTE — Progress Notes (Signed)
Subjective:    Patient ID: Erin Cook, female    DOB: Nov 29, 1935, 80 y.o.   MRN: [id number]  HPI  80YO female presents to establish care. Formerly a patient of Dr. Sharyon Medicus.  DM- BG higher recently, in upper 100s and 200s. Not on any medication. She is trying to follow a healthy diet and exercise.  Previously had thyroid removed for thyroid nodule. Currently taking Levothyroxine 52mcg daily.   Breast cancer - s/p bilateral mastectomy. Then had bilateral implants. Had a mammogram and had rupture of her implant, approx 1990.  Her sister recently died suddenly in a car accident. It has been a difficult time for her.  She notes some cold insensitivity in her right hand and some chronic swelling in her right arm ever since she had a mastectomy. Not having any treatment for this.   Wt Readings from Last 3 Encounters:  09/17/15 132 lb 6 oz (60.045 kg)  08/04/15 134 lb (60.782 kg)  02/10/15 127 lb 6.4 oz (57.788 kg)   BP Readings from Last 3 Encounters:  09/17/15 143/68  08/04/15 117/70  02/10/15 134/78    Past Medical History  Diagnosis Date  . Hyperlipidemia    Family History  Problem Relation Age of Onset  . Stroke Mother   . Stroke Father    Past Surgical History  Procedure Laterality Date  . Thyroidectomy  1980  . Mastectomy Bilateral 1978  . Breast reconstruction Bilateral 1978  . Tonsillectomy  1945  . Rotator cuff repair Right July, 2015, Jan, 2016    Dr. Lisette Grinder.  Dr.  Amedeo Plenty.   Marland Kitchen Cyst excision  1980    In her chest.    Social History   Social History  . Marital Status: Married    Spouse Name: N/A  . Number of Children: 1  . Years of Education: H/S   Occupational History  . Retired   . 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 Smoking status: Never Smoker   . Smokeless tobacco: Never Used  . Alcohol Use: No  . Drug Use: No  . Sexual Activity: Not Asked   Other Topics Concern  . None   Social History  Narrative   Lives with husband. Has one daughter, who lives in [place].      Work - Worked for [employer]. Retired in 1996.      Hobbies - sewing      Diet - regular       Exercise - Walks at [place]  Constitutional: Negative for fever, chills, appetite change, fatigue and unexpected weight change.  Eyes: Negative for visual disturbance.  Respiratory: Negative for shortness of breath.   Cardiovascular: Negative for chest pain and leg swelling.  Gastrointestinal: Negative for nausea, vomiting, abdominal pain, diarrhea and constipation.  Skin: Negative for color change and rash.  Hematological: Negative for adenopathy. Does not bruise/bleed easily.  Psychiatric/Behavioral: Negative for dysphoric mood. The patient is not nervous/anxious.        Objective:    BP 143/68 mmHg  Pulse 69  Temp(Src) 97.8 F (36.6 C) (Oral)  Ht 5\' 2"  (1.575 m)  Wt 132 lb 6 oz (60.045 kg)  BMI 24.21 kg/m2  SpO2 100% Physical Exam  Constitutional: She is oriented to person, place, and time. She appears well-developed and well-nourished. No distress.  HENT:  Head: Normocephalic and atraumatic.  Right Ear: External ear normal.  Left  Ear: External ear normal.  Nose: Nose normal.  Mouth/Throat: Oropharynx is clear and moist. No oropharyngeal exudate.  Eyes: Conjunctivae and EOM are normal. Pupils are equal, round, and reactive to light. Right eye exhibits no discharge.  Neck: Normal range of motion. Neck supple. No thyromegaly present.  Cardiovascular: Normal rate, regular rhythm, normal heart sounds and intact distal pulses.  Exam reveals no gallop and no friction rub.   No murmur heard. Pulmonary/Chest: Effort normal. No respiratory distress. She has no wheezes. She has no rales.  Abdominal: Soft. Bowel sounds are normal. She exhibits no distension and no mass. There is no tenderness. There is no rebound and no guarding.  Musculoskeletal: Normal range of motion. She exhibits no  edema or tenderness.  Lymphadenopathy:    She has no cervical adenopathy.  Neurological: She is alert and oriented to person, place, and time. No cranial nerve deficit. Coordination normal.  Skin: Skin is warm and dry. No rash noted. She is not diaphoretic. No erythema. No pallor.  Psychiatric: She has a normal mood and affect. Her behavior is normal. Judgment and thought content normal.          Assessment & Plan:   Problem List Items Addressed This Visit      Unprioritized   Breast CA ([place])    S/p bilateral mastectomy 1978.       Diabetes mellitus, type 2 ([place]) - Primary    Lab Results  Component Value Date   HGBA1C 7.3* 08/20/2015   Will start Metformin. Discussed potential benefits and risks of this medication. Recheck A1c in 3 months.      Relevant Medications   metFORMIN (GLUCOPHAGE) 500 MG tablet   Hypercholesteremia    Continue simvastatin. Will check lipids with next labs.      Hypothyroidism, postop    TSH is low. Will decrease Levothyroxine to 35mcg daily. Recheck TSH and Free T4 in 6 weeks.      Relevant Medications   levothyroxine (SYNTHROID, LEVOTHROID) 75 MCG tablet   Other Relevant Orders   TSH   T4, free   Lymphedema    Chronic lymphedema right arm after mastectomy. Discussed PT with a lymphedema specialist. She prefers to hold off for now, but will call if she would like to pursue this.          Return in about 6 weeks (around 10/29/2015) for Recheck.  Ronette Deter, MD Internal Medicine [facility]

## 2015-09-17 NOTE — Patient Instructions (Addendum)
It was nice to meet you!  Decrease Levothyroxine to 12mcg daily.  We will recheck labs in 6 weeks.  Start Metformin 500mg  twice daily to help control blood sugars.  Follow up in 4 weeks.

## 2015-09-17 NOTE — Progress Notes (Signed)
Pre visit review using our clinic review tool, if applicable. No additional management support is needed unless otherwise documented below in the visit note. 

## 2015-09-17 NOTE — Assessment & Plan Note (Signed)
Lab Results  Component Value Date   HGBA1C 7.3* 08/20/2015   Will start Metformin. Discussed potential benefits and risks of this medication. Recheck A1c in 3 months.

## 2015-09-17 NOTE — Assessment & Plan Note (Signed)
Chronic lymphedema right arm after mastectomy. Discussed PT with a lymphedema specialist. She prefers to hold off for now, but will call if she would like to pursue this.

## 2015-09-17 NOTE — Assessment & Plan Note (Signed)
S/p bilateral mastectomy 1978.

## 2015-09-17 NOTE — Assessment & Plan Note (Signed)
TSH is low. Will decrease Levothyroxine to 89mcg daily. Recheck TSH and Free T4 in 6 weeks.

## 2015-10-22 ENCOUNTER — Other Ambulatory Visit (INDEPENDENT_AMBULATORY_CARE_PROVIDER_SITE_OTHER): Payer: PPO

## 2015-10-22 DIAGNOSIS — E890 Postprocedural hypothyroidism: Secondary | ICD-10-CM | POA: Diagnosis not present

## 2015-10-27 ENCOUNTER — Encounter: Payer: Self-pay | Admitting: Internal Medicine

## 2015-10-27 ENCOUNTER — Telehealth: Payer: Self-pay

## 2015-10-27 ENCOUNTER — Ambulatory Visit (INDEPENDENT_AMBULATORY_CARE_PROVIDER_SITE_OTHER): Payer: PPO | Admitting: Internal Medicine

## 2015-10-27 VITALS — BP 139/82 | HR 67 | Ht 62.0 in | Wt 131.8 lb

## 2015-10-27 DIAGNOSIS — E119 Type 2 diabetes mellitus without complications: Secondary | ICD-10-CM | POA: Diagnosis not present

## 2015-10-27 DIAGNOSIS — E890 Postprocedural hypothyroidism: Secondary | ICD-10-CM

## 2015-10-27 DIAGNOSIS — E7800 Pure hypercholesterolemia, unspecified: Secondary | ICD-10-CM

## 2015-10-27 NOTE — Progress Notes (Signed)
Pre visit review using our clinic review tool, if applicable. No additional management support is needed unless otherwise documented below in the visit note. 

## 2015-10-27 NOTE — Assessment & Plan Note (Signed)
BG well controlled by report. Will plan to repeat A1c in 11/2015.

## 2015-10-27 NOTE — Assessment & Plan Note (Signed)
Thyroid labs are pending. Continue Levothyroxine.

## 2015-10-27 NOTE — Assessment & Plan Note (Signed)
Having myalgia and arthralgia with Simvastatin. Will hold medication. Follow up in 4 weeks.

## 2015-10-27 NOTE — Patient Instructions (Signed)
Stop Simvastatin.  Then, let us know if joint pain is improved.

## 2015-10-27 NOTE — Telephone Encounter (Signed)
Spoke with patient about missing tsh labs.  She will have blood draw at next visit.

## 2015-10-27 NOTE — Progress Notes (Signed)
Subjective:    Patient ID: Erin Cook, female    DOB: 03/29/1936, 80 y.o.   MRN: [id number]  HPI  80YO female presents for follow up.  Recently seen to establish care and for check of DM. Started on Metformin. Dose of Synthroid decreased to 61mcg daily.  Concerned that Simvastatin is causing joint pain. Has been skipping medication doses.  DM - BG have been variable. BG have been near 80-130. Started Metformin, but had some loose stools initially, now improved.  Lab Results  Component Value Date   HGBA1C 7.3* 08/20/2015     Wt Readings from Last 3 Encounters:  10/27/15 131 lb 12.8 oz (59.784 kg)  09/17/15 132 lb 6 oz (60.045 kg)  08/04/15 134 lb (60.782 kg)   BP Readings from Last 3 Encounters:  10/27/15 139/82  09/17/15 143/68  08/04/15 117/70    Past Medical History  Diagnosis Date  . Hyperlipidemia    Family History  Problem Relation Age of Onset  . Stroke Mother   . Stroke Father    Past Surgical History  Procedure Laterality Date  . Thyroidectomy  1980  . Mastectomy Bilateral 1978  . Breast reconstruction Bilateral 1978  . Tonsillectomy  1945  . Rotator cuff repair Right July, 2015, Jan, 2016    Dr. Lisette Grinder.  Dr.  Amedeo Plenty.   Marland Kitchen Cyst excision  1980    In her chest.    Social History   Social History  . Marital Status: Married    Spouse Name: N/A  . Number of Children: 1  . Years of Education: H/S   Occupational History  . Retired   . 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 Smoking status: Never Smoker   . Smokeless tobacco: Never Used  . Alcohol Use: No  . Drug Use: No  . Sexual Activity: Not Asked   Other Topics Concern  . None   Social History Narrative   Lives with husband. Has one daughter, who lives in [place].      Work - Worked for [employer]. Retired in 1996.      Hobbies - sewing      Diet - regular       Exercise - Walks at [place]  Constitutional: Negative  for fever, chills, appetite change, fatigue and unexpected weight change.  Eyes: Negative for visual disturbance.  Respiratory: Negative for cough and shortness of breath.   Cardiovascular: Negative for chest pain and leg swelling.  Gastrointestinal: Negative for nausea, vomiting, abdominal pain, diarrhea and constipation.  Musculoskeletal: Positive for myalgias and arthralgias.  Skin: Negative for color change and rash.  Hematological: Negative for adenopathy. Does not bruise/bleed easily.  Psychiatric/Behavioral: Negative for sleep disturbance and dysphoric mood. The patient is not nervous/anxious.        Objective:    BP 139/82 mmHg  Pulse 67  Ht 5\' 2"  (1.575 m)  Wt 131 lb 12.8 oz (59.784 kg)  BMI 24.10 kg/m2  SpO2 99% Physical Exam  Constitutional: She is oriented to person, place, and time. She appears well-developed and well-nourished. No distress.  HENT:  Head: Normocephalic and atraumatic.  Right Ear: External ear normal.  Left Ear: External ear normal.  Nose: Nose normal.  Mouth/Throat: Oropharynx is clear and moist. No oropharyngeal exudate.  Eyes: Conjunctivae are normal. Pupils are equal, round, and reactive to light. Right eye exhibits no discharge. Left eye exhibits  no discharge. No scleral icterus.  Neck: Normal range of motion. Neck supple. No tracheal deviation present. No thyromegaly present.  Cardiovascular: Normal rate, regular rhythm, normal heart sounds and intact distal pulses.  Exam reveals no gallop and no friction rub.   No murmur heard. Pulmonary/Chest: Effort normal and breath sounds normal. No respiratory distress. She has no wheezes. She has no rales. She exhibits no tenderness.  Musculoskeletal: Normal range of motion. She exhibits no edema or tenderness.  Lymphadenopathy:    She has no cervical adenopathy.  Neurological: She is alert and oriented to person, place, and time. No cranial nerve deficit. She exhibits normal muscle tone. Coordination  normal.  Skin: Skin is warm and dry. No rash noted. She is not diaphoretic. No erythema. No pallor.  Psychiatric: She has a normal mood and affect. Her behavior is normal. Judgment and thought content normal.          Assessment & Plan:   Problem List Items Addressed This Visit      Unprioritized   Diabetes mellitus, type 2 ([place])    BG well controlled by report. Will plan to repeat A1c in 11/2015.      Hypercholesteremia    Having myalgia and arthralgia with Simvastatin. Will hold medication. Follow up in 4 weeks.      Hypothyroidism, postop - Primary    Thyroid labs are pending. Continue Levothyroxine.      Relevant Medications   levothyroxine (SYNTHROID, LEVOTHROID) 75 MCG tablet       Return in about 4 weeks (around 11/24/2015) for Recheck of Diabetes.  Ronette Deter, MD Internal Medicine [facility]

## 2015-10-28 ENCOUNTER — Telehealth: Payer: Self-pay | Admitting: *Deleted

## 2015-10-28 ENCOUNTER — Other Ambulatory Visit: Payer: PPO

## 2015-10-28 DIAGNOSIS — E039 Hypothyroidism, unspecified: Secondary | ICD-10-CM | POA: Diagnosis not present

## 2015-10-28 LAB — TSH: TSH: 0.44 mIU/L

## 2015-10-28 LAB — T4, FREE: Free T4: 2.6 ng/dL — ABNORMAL HIGH (ref 0.8–1.8)

## 2015-10-28 NOTE — Telephone Encounter (Signed)
FYI Had a paper on my desk to check on pt labs, called our lab to see what was going on, they had the tube but apparently it didn't get run they are sending it to [facility] now as a STAT since it was there fault on there end, it should be resulted today.

## 2015-11-06 ENCOUNTER — Encounter: Payer: Self-pay | Admitting: Internal Medicine

## 2015-11-25 ENCOUNTER — Ambulatory Visit: Payer: PPO | Admitting: Internal Medicine

## 2015-11-26 ENCOUNTER — Ambulatory Visit (INDEPENDENT_AMBULATORY_CARE_PROVIDER_SITE_OTHER): Payer: PPO | Admitting: Internal Medicine

## 2015-11-26 ENCOUNTER — Encounter: Payer: Self-pay | Admitting: Internal Medicine

## 2015-11-26 VITALS — HR 69 | Ht 62.0 in | Wt 127.0 lb

## 2015-11-26 DIAGNOSIS — E7800 Pure hypercholesterolemia, unspecified: Secondary | ICD-10-CM

## 2015-11-26 DIAGNOSIS — E119 Type 2 diabetes mellitus without complications: Secondary | ICD-10-CM

## 2015-11-26 DIAGNOSIS — E890 Postprocedural hypothyroidism: Secondary | ICD-10-CM

## 2015-11-26 LAB — COMPREHENSIVE METABOLIC PANEL
ALBUMIN: 4.4 g/dL (ref 3.5–5.2)
ALK PHOS: 56 U/L (ref 39–117)
ALT: 13 U/L (ref 0–35)
AST: 19 U/L (ref 0–37)
BILIRUBIN TOTAL: 0.6 mg/dL (ref 0.2–1.2)
BUN: 13 mg/dL (ref 6–23)
CALCIUM: 9.9 mg/dL (ref 8.4–10.5)
CO2: 30 mEq/L (ref 19–32)
CREATININE: 0.62 mg/dL (ref 0.40–1.20)
Chloride: 104 mEq/L (ref 96–112)
GFR: 99.07 mL/min (ref >60.00)
Glucose, Bld: 117 mg/dL — ABNORMAL HIGH (ref 70–99)
Potassium: 4.4 mEq/L (ref 3.5–5.1)
Sodium: 140 mEq/L (ref 135–145)
Total Protein: 6.4 g/dL (ref 6.0–8.3)

## 2015-11-26 LAB — LIPID PANEL
CHOL/HDL RATIO: 4
CHOLESTEROL: 213 mg/dL — AB (ref 0–200)
HDL: 48.3 mg/dL (ref >39.00)
LDL CALC: 138 mg/dL — AB (ref 0–99)
NonHDL: 164.24
Triglycerides: 131 mg/dL (ref 0.0–149.0)
VLDL: 26.2 mg/dL (ref 0.0–40.0)

## 2015-11-26 LAB — TSH: TSH: 0.47 u[IU]/mL (ref 0.35–4.50)

## 2015-11-26 LAB — HEMOGLOBIN A1C: Hgb A1c MFr Bld: 6.8 % — ABNORMAL HIGH (ref 4.6–6.5)

## 2015-11-26 NOTE — Assessment & Plan Note (Signed)
TSH with labs today. Continue Levothyroxine.

## 2015-11-26 NOTE — Progress Notes (Signed)
Subjective:    Patient ID: Erin Cook, female    DOB: 21-Jul-1935, 80 y.o.   MRN: [id number]  HPI  80YO female presents for follow up.  DM - BG running near 130s. Compliant with medication. Lowest 89. Compliant with medication.  No new concerns today.  Wt Readings from Last 3 Encounters:  11/26/15 127 lb (57.607 kg)  10/27/15 131 lb 12.8 oz (59.784 kg)  09/17/15 132 lb 6 oz (60.045 kg)   BP Readings from Last 3 Encounters:  10/27/15 139/82  09/17/15 143/68  08/04/15 117/70    Past Medical History  Diagnosis Date  . Hyperlipidemia    Family History  Problem Relation Age of Onset  . Stroke Mother   . Stroke Father    Past Surgical History  Procedure Laterality Date  . Thyroidectomy  1980  . Mastectomy Bilateral 1978  . Breast reconstruction Bilateral 1978  . Tonsillectomy  1945  . Rotator cuff repair Right July, 2015, Jan, 2016    Dr. Lisette Grinder.  Dr.  Amedeo Plenty.   Marland Kitchen Cyst excision  1980    In her chest.    Social History   Social History  . Marital Status: Married    Spouse Name: N/A  . Number of Children: 1  . Years of Education: H/S   Occupational History  . Retired   . 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 Smoking status: Never Smoker   . Smokeless tobacco: Never Used  . Alcohol Use: No  . Drug Use: No  . Sexual Activity: Not Asked   Other Topics Concern  . None   Social History Narrative   Lives with husband. Has one daughter, who lives in [place].      Work - Worked for [employer]. Retired in 1996.      Hobbies - sewing      Diet - regular       Exercise - Walks at [place]  Constitutional: Negative for fever, chills, appetite change, fatigue and unexpected weight change.  Eyes: Negative for visual disturbance.  Respiratory: Negative for shortness of breath.   Cardiovascular: Negative for chest pain and leg swelling.  Gastrointestinal: Negative for nausea, vomiting, abdominal  pain, diarrhea and constipation.  Musculoskeletal: Negative for myalgias and arthralgias.  Skin: Negative for color change and rash.  Hematological: Negative for adenopathy. Does not bruise/bleed easily.  Psychiatric/Behavioral: Negative for sleep disturbance and dysphoric mood. The patient is not nervous/anxious.        Objective:    Ht 5\' 2"  (1.575 m)  Wt 127 lb (57.607 kg)  BMI 23.22 kg/m2 Physical Exam  Constitutional: She is oriented to person, place, and time. She appears well-developed and well-nourished. No distress.  HENT:  Head: Normocephalic and atraumatic.  Right Ear: External ear normal.  Left Ear: External ear normal.  Nose: Nose normal.  Mouth/Throat: Oropharynx is clear and moist. No oropharyngeal exudate.  Eyes: Conjunctivae are normal. Pupils are equal, round, and reactive to light. Right eye exhibits no discharge. Left eye exhibits no discharge. No scleral icterus.  Neck: Normal range of motion. Neck supple. No tracheal deviation present. No thyromegaly present.  Cardiovascular: Normal rate, regular rhythm, normal heart sounds and intact distal pulses.  Exam reveals no gallop and no friction rub.   No murmur heard. Pulmonary/Chest: Effort normal and breath sounds normal. No respiratory distress. She has no wheezes. She has no rales.  She exhibits no tenderness.  Musculoskeletal: Normal range of motion. She exhibits no edema or tenderness.  Lymphadenopathy:    She has no cervical adenopathy.  Neurological: She is alert and oriented to person, place, and time. No cranial nerve deficit. She exhibits normal muscle tone. Coordination normal.  Skin: Skin is warm and dry. No rash noted. She is not diaphoretic. No erythema. No pallor.  Psychiatric: She has a normal mood and affect. Her behavior is normal. Judgment and thought content normal.          Assessment & Plan:   Problem List Items Addressed This Visit      Unprioritized   Diabetes mellitus, type 2 ([place]) -  Primary    BG well controlled by report. A1c with labs today. Continue metformin.      Relevant Orders   Comprehensive metabolic panel   Hemoglobin A1c   Hypercholesteremia    Lipids with labs today.      Relevant Orders   Lipid panel   Hypothyroidism, postop    TSH with labs today. Continue Levothyroxine.      Relevant Orders   TSH       Return in about 3 months (around 02/26/2016) for Recheck of Diabetes.  Ronette Deter, MD Internal Medicine [facility]

## 2015-11-26 NOTE — Assessment & Plan Note (Signed)
BG well controlled by report. A1c with labs today. Continue metformin.

## 2015-11-26 NOTE — Patient Instructions (Signed)
Labs today.   Follow up in 3 months.  

## 2015-11-26 NOTE — Assessment & Plan Note (Signed)
Lipids with labs today.

## 2015-11-26 NOTE — Progress Notes (Signed)
Pre visit review using our clinic review tool, if applicable. No additional management support is needed unless otherwise documented below in the visit note. 

## 2015-12-09 DIAGNOSIS — E119 Type 2 diabetes mellitus without complications: Secondary | ICD-10-CM | POA: Diagnosis not present

## 2015-12-09 LAB — HM DIABETES EYE EXAM

## 2016-01-11 ENCOUNTER — Encounter: Payer: Self-pay | Admitting: Internal Medicine

## 2016-02-29 ENCOUNTER — Telehealth: Payer: Self-pay

## 2016-02-29 DIAGNOSIS — E785 Hyperlipidemia, unspecified: Secondary | ICD-10-CM

## 2016-02-29 DIAGNOSIS — E119 Type 2 diabetes mellitus without complications: Secondary | ICD-10-CM

## 2016-02-29 NOTE — Telephone Encounter (Signed)
Pt coming for fasting labs 03/01/16. Please place future orders. Pt is seeing Dr. Caryl Bis 03/03/16. Former Dr. Gilford Rile patient.

## 2016-02-29 NOTE — Telephone Encounter (Signed)
Orders placed. Chart was reviewed to determine orders.

## 2016-03-01 ENCOUNTER — Other Ambulatory Visit (INDEPENDENT_AMBULATORY_CARE_PROVIDER_SITE_OTHER): Payer: PPO

## 2016-03-01 DIAGNOSIS — E119 Type 2 diabetes mellitus without complications: Secondary | ICD-10-CM

## 2016-03-01 DIAGNOSIS — E785 Hyperlipidemia, unspecified: Secondary | ICD-10-CM | POA: Diagnosis not present

## 2016-03-01 LAB — COMPREHENSIVE METABOLIC PANEL
ALK PHOS: 54 U/L (ref 39–117)
ALT: 15 U/L (ref 0–35)
AST: 21 U/L (ref 0–37)
Albumin: 4.1 g/dL (ref 3.5–5.2)
BILIRUBIN TOTAL: 0.4 mg/dL (ref 0.2–1.2)
BUN: 12 mg/dL (ref 6–23)
CO2: 29 mEq/L (ref 19–32)
CREATININE: 0.64 mg/dL (ref 0.40–1.20)
Calcium: 8.9 mg/dL (ref 8.4–10.5)
Chloride: 102 mEq/L (ref 96–112)
GFR: 95.44 mL/min (ref >60.00)
GLUCOSE: 128 mg/dL — AB (ref 70–99)
Potassium: 4.2 mEq/L (ref 3.5–5.1)
Sodium: 138 mEq/L (ref 135–145)
TOTAL PROTEIN: 6.9 g/dL (ref 6.0–8.3)

## 2016-03-01 LAB — HEMOGLOBIN A1C: Hgb A1c MFr Bld: 6.6 % — ABNORMAL HIGH (ref 4.6–6.5)

## 2016-03-01 LAB — LDL CHOLESTEROL, DIRECT: LDL DIRECT: 143 mg/dL

## 2016-03-03 ENCOUNTER — Ambulatory Visit (INDEPENDENT_AMBULATORY_CARE_PROVIDER_SITE_OTHER): Payer: PPO | Admitting: Family Medicine

## 2016-03-03 ENCOUNTER — Encounter: Payer: Self-pay | Admitting: Family Medicine

## 2016-03-03 ENCOUNTER — Ambulatory Visit: Payer: PPO | Admitting: Internal Medicine

## 2016-03-03 VITALS — BP 122/72 | HR 85 | Temp 98.3°F | Wt 129.2 lb

## 2016-03-03 DIAGNOSIS — E039 Hypothyroidism, unspecified: Secondary | ICD-10-CM

## 2016-03-03 DIAGNOSIS — K219 Gastro-esophageal reflux disease without esophagitis: Secondary | ICD-10-CM | POA: Diagnosis not present

## 2016-03-03 DIAGNOSIS — E890 Postprocedural hypothyroidism: Secondary | ICD-10-CM | POA: Diagnosis not present

## 2016-03-03 DIAGNOSIS — E7800 Pure hypercholesterolemia, unspecified: Secondary | ICD-10-CM

## 2016-03-03 DIAGNOSIS — E119 Type 2 diabetes mellitus without complications: Secondary | ICD-10-CM | POA: Diagnosis not present

## 2016-03-03 LAB — TSH: TSH: 0.71 u[IU]/mL (ref 0.35–4.50)

## 2016-03-03 LAB — MICROALBUMIN / CREATININE URINE RATIO
Creatinine,U: 41.9 mg/dL
Microalb Creat Ratio: 1.7 mg/g (ref 0.0–30.0)
Microalb, Ur: <0.7 mg/dL (ref 0.0–1.9)

## 2016-03-03 MED ORDER — LEVOTHYROXINE SODIUM 88 MCG PO TABS: 88.0000 ug | ORAL_TABLET | Freq: Every day | ORAL | 1 refills | Status: DC

## 2016-03-03 MED ORDER — SIMVASTATIN 10 MG PO TABS: 10.0000 mg | ORAL_TABLET | Freq: Every day | ORAL | 3 refills | Status: DC

## 2016-03-03 NOTE — Patient Instructions (Addendum)
Nice to meet you. We will increase her Synthroid back to 88 g daily. We will start you on simvastatin.

## 2016-03-03 NOTE — Assessment & Plan Note (Signed)
LDL is actually slightly worsened from previously. Discussed the cardiovascular risk reduction benefits of taking a statin given that she has diabetes and her cholesterol. She opted to start back on simvastatin and see if she has any issues with this. She'll monitor for muscle and joint aches.

## 2016-03-03 NOTE — Assessment & Plan Note (Signed)
We'll check TSH today as it was not checked several days ago with her labs. Given symptoms increasing with decreased dose of Synthroid we will increase back to 88 g daily. May need to reevaluate this if TSH is low.

## 2016-03-03 NOTE — Assessment & Plan Note (Signed)
Well-controlled. Continue omeprazole as needed.

## 2016-03-03 NOTE — Assessment & Plan Note (Signed)
Well-controlled with last A1c 6.6. Foot exam completed today. We'll complete a urine microalbumin as well. Continue metformin.

## 2016-03-03 NOTE — Progress Notes (Signed)
Pre visit review using our clinic review tool, if applicable. No additional management support is needed unless otherwise documented below in the visit note. 

## 2016-03-03 NOTE — Progress Notes (Signed)
  [name], MD Phone: [phone]  Erin Cook is a 80 y.o. female who presents today for follow-up.  DIABETES Disease Monitoring: Blood Sugar ranges-fasting 87-147 Polyuria/phagia/dipsia- no      ophthalmology- she is up-to-date on this Medications: Compliance- taking metformin Hypoglycemic symptoms- no  GERD: Patient notes taking omeprazole once every 2 weeks. Gets burning and sour taste. No abdominal pain or blood in her stool.  HYPOTHYROIDISM Disease Monitoring Weight changes: No  Skin Changes: Some loss of hair Heat/Cold intolerance: Some cold intolerance  Medication Monitoring Compliance:  Taking Synthroid, had dose decreased about 6 months ago and notes that is about the time she had issues with her hair   Last TSH:   Lab Results  Component Value Date   TSH 0.47 11/26/2015   Elevated LDL: Patient's LDL is actually gone up since coming off of simvastatin. She noted some joint aches with the simvastatin though no myalgias. Has not been on this in some time. Has been trying to eat oatmeal to see if that is beneficial. Family history of stroke.   PMH: nonsmoker.   ROS see history of present illness  Objective  Physical Exam Vitals:   03/03/16 1034  BP: 122/72  Pulse: 85  Temp: 98.3 F (36.8 C)    BP Readings from Last 3 Encounters:  03/03/16 122/72  10/27/15 139/82  09/17/15 (!) 143/68   Wt Readings from Last 3 Encounters:  03/03/16 129 lb 3.2 oz (58.6 kg)  11/26/15 127 lb (57.6 kg)  10/27/15 131 lb 12.8 oz (59.8 kg)    Physical Exam  Constitutional: No distress.  HENT:  Head: Normocephalic and atraumatic.  Cardiovascular: Normal rate, regular rhythm and normal heart sounds.   Pulmonary/Chest: Effort normal and breath sounds normal.  Abdominal: Soft. Bowel sounds are normal. She exhibits no distension. There is no tenderness. There is no rebound and no guarding.  Neurological: She is alert. Gait normal.  Skin: Skin is warm and dry. She is  not diaphoretic.  Hair is mildly thin     Assessment/Plan: Please see individual problem list.  Acid reflux Well-controlled. Continue omeprazole as needed.  Diabetes mellitus, type 2 Well-controlled with last A1c 6.6. Foot exam completed today. We'll complete a urine microalbumin as well. Continue metformin.  Hypothyroidism, postop We'll check TSH today as it was not checked several days ago with her labs. Given symptoms increasing with decreased dose of Synthroid we will increase back to 88 g daily. May need to reevaluate this if TSH is low.  Hypercholesteremia LDL is actually slightly worsened from previously. Discussed the cardiovascular risk reduction benefits of taking a statin given that she has diabetes and her cholesterol. She opted to start back on simvastatin and see if she has any issues with this. She'll monitor for muscle and joint aches.   Orders Placed This Encounter  Procedures  . TSH  . Urine Microalbumin w/creat. ratio    Meds ordered this encounter  Medications  . levothyroxine (SYNTHROID, LEVOTHROID) 88 MCG tablet    Sig: Take 1 tablet (88 mcg total) by mouth daily before breakfast.    Dispense:  90 tablet    Refill:  1  . simvastatin (ZOCOR) 10 MG tablet    Sig: Take 1 tablet (10 mg total) by mouth at bedtime.    Dispense:  90 tablet    Refill:  [place], MD [place]

## 2016-07-19 ENCOUNTER — Other Ambulatory Visit: Payer: Self-pay | Admitting: Family Medicine

## 2016-07-28 ENCOUNTER — Telehealth: Payer: Self-pay | Admitting: Family Medicine

## 2016-07-28 NOTE — Telephone Encounter (Signed)
Pt switched to Dr Caryl Bis pt does not need a appt at this time. Thank you!

## 2016-11-21 ENCOUNTER — Ambulatory Visit (INDEPENDENT_AMBULATORY_CARE_PROVIDER_SITE_OTHER): Payer: PPO

## 2016-11-21 VITALS — BP 138/78 | HR 70 | Temp 97.9°F | Resp 12 | Ht 62.0 in | Wt 135.8 lb

## 2016-11-21 DIAGNOSIS — Z0000 Encounter for general adult medical examination without abnormal findings: Secondary | ICD-10-CM | POA: Diagnosis not present

## 2016-11-21 NOTE — Progress Notes (Signed)
Subjective:   LAVIDA PATCH is a 81 y.o. female who presents for an Initial Medicare Annual Wellness Visit.  Review of Systems    No ROS.  Medicare Wellness Visit.  Cardiac Risk Factors include: advanced age (>21men, >72 women);diabetes mellitus     Objective:    Today's Vitals   11/21/16 1004  BP: 138/78  Pulse: 70  Resp: 12  Temp: 97.9 F (36.6 C)  TempSrc: Oral  SpO2: 96%  Weight: 135 lb 12.8 oz (61.6 kg)  Height: 5\' 2"  (1.575 m)   Body mass index is 24.84 kg/m.   Current Medications (verified) Outpatient Encounter Prescriptions as of 11/21/2016  Medication Sig  . glucose blood test strip 1 each as needed. Use as instructed with One Touch Ultra monitor to test blood sugar once daily  . levothyroxine (SYNTHROID, LEVOTHROID) 88 MCG tablet TAKE ONE TABLET BY MOUTH EVERY MORNING BEFORE BREAKFAST  . omeprazole (PRILOSEC) 20 MG capsule Take by mouth.  . pyridOXINE (VITAMIN B-6) 100 MG tablet Take 200 mg by mouth daily.  . simvastatin (ZOCOR) 10 MG tablet Take 1 tablet (10 mg total) by mouth at bedtime.  . metFORMIN (GLUCOPHAGE) 500 MG tablet Take 1 tablet (500 mg total) by mouth 2 (two) times daily with a meal. (Patient not taking: Reported on 11/21/2016)   No facility-administered encounter medications on file as of 11/21/2016.     Allergies (verified) Patient has no known allergies.   History: Past Medical History:  Diagnosis Date  . Hyperlipidemia    Past Surgical History:  Procedure Laterality Date  . BREAST RECONSTRUCTION Bilateral 1978  . CYST EXCISION  1980   In her chest.   . MASTECTOMY Bilateral 1978  . ROTATOR CUFF REPAIR Right July, 2015, Jan, 2016   Dr. Lisette Grinder.  Dr.  Amedeo Plenty.   . THYROIDECTOMY  1980  . TONSILLECTOMY  1945   Family History  Problem Relation Age of Onset  . Stroke Mother   . Stroke Father    Social History   Occupational History  . Retired   . Volunteers at [employer] 10 hours a week    Social History Main Topics  .  Smoking status: Never Smoker  . Smokeless tobacco: Never Used  . Alcohol use No  . Drug use: No  . Sexual activity: Yes    Tobacco Counseling Counseling given: Not Answered   Activities of Daily Living In your present state of health, do you have any difficulty performing the following activities: 11/21/2016 11/26/2015  Hearing? N N  Vision? N N  Difficulty concentrating or making decisions? N N  Walking or climbing stairs? N N  Dressing or bathing? N N  Doing errands, shopping? N N  Preparing Food and eating ? N -  Using the Toilet? N -  In the past six months, have you accidently leaked urine? N -  Do you have problems with loss of bowel control? N -  Managing your Medications? N -  Managing your Finances? N -  Housekeeping or managing your Housekeeping? N -  Some recent data might be hidden    Immunizations and Health Maintenance Immunization History  Administered Date(s) Administered  . Influenza Split 06/05/2003, 04/05/2012, 03/23/2013  . Influenza, High Dose Seasonal PF 03/27/2014  . Influenza-Unspecified 03/10/2015  . Pneumococcal Conjugate-13 08/04/2015  . Pneumococcal Polysaccharide-23 09/16/2005  . Tdap 03/17/2006  . Zoster 08/22/2012   Health Maintenance Due  Topic Date Due  . DEXA SCAN  05/24/2003  . TETANUS/TDAP  03/17/2016  . HEMOGLOBIN A1C  08/31/2016    Patient Care Team: Leone Haven, MD as PCP - General (Family Medicine)  Indicate any recent Medical Services you may have received from other than [facility] providers in the past year (date may be approximate).     Assessment:   This is a routine wellness examination for [facility]. The goal of the wellness visit is to assist the patient how to close the gaps in care and create a preventative care plan for the patient.   Osteoporosis risk reviewed.  Medications reviewed; taking without issues or barriers.  Safety issues reviewed; alarm, smoke and carbon monoxide detectors in the home. No  firearms in the home.  Wears seatbelts when driving or riding with others. Patient does wear sunscreen or protective clothing when in direct sunlight. No violence in the home.  Depression- PHQ 2 &9 complete.  No signs/symptoms or verbal communication regarding little pleasure in doing things, feeling down, depressed or hopeless. No changes in sleeping, energy, eating, concentrating.  No thoughts of self harm or harm towards others.  Time spent on this topic is 8 minutes.   Patient is alert, normal appearance, oriented to person/place/and time. Correctly identified the president of the [place], recall of 3/3 words, and performing simple calculations.  Patient displays appropriate judgement and can read correct time from watch face.  No new identified risk were noted.  No failures at ADL's or IADL's.   BMI- discussed the importance of a healthy diet, water intake and exercise. Educational material provided.   Daily fluid intake: 1 cups of caffeine, 5 cups of water  HTN- followed by PCP.  Dental- every six months.    Sleep patterns- Sleeps 5-6 hours at night.  Wakes feeling rested.  TDAP vaccine and DEXA Scan deferred per patient preference.  Educational material provided.  Patient Concerns: None at this time. Follow up with PCP as needed.  Hearing/Vision screen Hearing Screening Comments: Patient is able to hear conversational tones without difficulty.  No issues reported.  Vision Screening Comments: Followed by [facility] Wears corrective lenses, L eye only Last OV 11/2016 Cataract extraction, bilateral Visual acuity not assessed per patient preference since they have regular follow up with the ophthalmologist  Dietary issues and exercise activities discussed: Current Exercise Habits: Home exercise routine, Type of exercise: walking;stretching, Time (Minutes): 20, Frequency (Times/Week): 4, Weekly Exercise (Minutes/Week): 80, Intensity: Mild  Goals    . Healthy Lifestyle            Low carb foods Stay active and continue walking for exercise Stay hydrated and drink plenty of fluids      Depression Screen PHQ 2/9 Scores 11/21/2016 11/26/2015 08/04/2015 02/10/2015  PHQ - 2 Score 0 0 0 0  PHQ- 9 Score 0 - - -  Exception Documentation - - - Medical reason    Fall Risk Fall Risk  11/21/2016 11/26/2015 08/04/2015 02/10/2015  Falls in the past year? No No No Yes  Number falls in past yr: - - - 1  Injury with Fall? - - - Yes  Risk Factor Category  - - - High Fall Risk    Cognitive Function:     6CIT Screen 11/21/2016  What Year? 0 points  What month? 0 points  What time? 0 points  Count back from 20 0 points  Months in reverse 0 points  Repeat phrase 0 points  Total Score 0    Screening Tests Health Maintenance  Topic Date Due  .  DEXA SCAN  05/24/2003  . TETANUS/TDAP  03/17/2016  . HEMOGLOBIN A1C  08/31/2016  . OPHTHALMOLOGY EXAM  12/08/2016  . INFLUENZA VACCINE  02/01/2017  . FOOT EXAM  03/03/2017  . URINE MICROALBUMIN  03/03/2017  . PNA vac Low Risk Adult  Completed      Plan:    End of life planning; Advance aging; Advanced directives discussed. Copy of current HCPOA/Living Will requested.    I have personally reviewed and noted the following in the patient's chart:   . Medical and social history . Use of alcohol, tobacco or illicit drugs  . Current medications and supplements . Functional ability and status . Nutritional status . Physical activity . Advanced directives . List of other physicians . Hospitalizations, surgeries, and ER visits in previous 12 months . Vitals . Screenings to include cognitive, depression, and falls . Referrals and appointments  In addition, I have reviewed and discussed with patient certain preventive protocols, quality metrics, and best practice recommendations. A written personalized care plan for preventive services as well as general preventive health recommendations were provided to patient.      Varney Biles, LPN   1/65/7903

## 2016-11-21 NOTE — Patient Instructions (Addendum)
  Erin Cook , Thank you for taking time to come for your Medicare Wellness Visit. I appreciate your ongoing commitment to your health goals. Please review the following plan we discussed and let me know if I can assist you in the future.   Follow up with Dr. Caryl Bis as needed.    Bring a copy of your [place] and/or Living Will to be scanned into chart.  Have a great day!  These are the goals we discussed: Goals    . Healthy Lifestyle          Low carb foods Stay active and continue walking for exercise Stay hydrated and drink plenty of fluids       This is a list of the screening recommended for you and due dates:  Health Maintenance  Topic Date Due  . DEXA scan (bone density measurement)  05/24/2003  . Tetanus Vaccine  03/17/2016  . Hemoglobin A1C  08/31/2016  . Eye exam for diabetics  12/08/2016  . Flu Shot  02/01/2017  . Complete foot exam   03/03/2017  . Urine Protein Check  03/03/2017  . Pneumonia vaccines  Completed

## 2016-11-22 DIAGNOSIS — E119 Type 2 diabetes mellitus without complications: Secondary | ICD-10-CM | POA: Diagnosis not present

## 2016-11-22 NOTE — Progress Notes (Signed)
I have reviewed the above note and agree.  Akia Desroches, M.D.  

## 2016-12-30 ENCOUNTER — Ambulatory Visit (INDEPENDENT_AMBULATORY_CARE_PROVIDER_SITE_OTHER): Payer: PPO | Admitting: Family Medicine

## 2016-12-30 ENCOUNTER — Other Ambulatory Visit: Payer: Self-pay | Admitting: Family Medicine

## 2016-12-30 ENCOUNTER — Encounter: Payer: Self-pay | Admitting: Family Medicine

## 2016-12-30 VITALS — BP 154/74 | HR 67 | Temp 97.8°F | Wt 133.0 lb

## 2016-12-30 DIAGNOSIS — E785 Hyperlipidemia, unspecified: Secondary | ICD-10-CM

## 2016-12-30 DIAGNOSIS — E7800 Pure hypercholesterolemia, unspecified: Secondary | ICD-10-CM

## 2016-12-30 DIAGNOSIS — E890 Postprocedural hypothyroidism: Secondary | ICD-10-CM | POA: Diagnosis not present

## 2016-12-30 DIAGNOSIS — E119 Type 2 diabetes mellitus without complications: Secondary | ICD-10-CM | POA: Diagnosis not present

## 2016-12-30 DIAGNOSIS — E039 Hypothyroidism, unspecified: Secondary | ICD-10-CM

## 2016-12-30 DIAGNOSIS — D229 Melanocytic nevi, unspecified: Secondary | ICD-10-CM | POA: Insufficient documentation

## 2016-12-30 DIAGNOSIS — Z853 Personal history of malignant neoplasm of breast: Secondary | ICD-10-CM | POA: Diagnosis not present

## 2016-12-30 LAB — LIPID PANEL
Cholesterol: 248 mg/dL — ABNORMAL HIGH (ref 0–200)
HDL: 50.6 mg/dL (ref >39.00)
LDL CALC: 164 mg/dL — AB (ref 0–99)
NonHDL: 197.26
TRIGLYCERIDES: 167 mg/dL — AB (ref 0.0–149.0)
Total CHOL/HDL Ratio: 5
VLDL: 33.4 mg/dL (ref 0.0–40.0)

## 2016-12-30 LAB — TSH: TSH: 0.54 u[IU]/mL (ref 0.35–4.50)

## 2016-12-30 LAB — COMPREHENSIVE METABOLIC PANEL
ALBUMIN: 4.5 g/dL (ref 3.5–5.2)
ALK PHOS: 58 U/L (ref 39–117)
ALT: 13 U/L (ref 0–35)
AST: 20 U/L (ref 0–37)
BUN: 15 mg/dL (ref 6–23)
CALCIUM: 9.7 mg/dL (ref 8.4–10.5)
CHLORIDE: 105 meq/L (ref 96–112)
CO2: 28 mEq/L (ref 19–32)
CREATININE: 0.76 mg/dL (ref 0.40–1.20)
GFR: 78.11 mL/min (ref >60.00)
Glucose, Bld: 157 mg/dL — ABNORMAL HIGH (ref 70–99)
Potassium: 4 mEq/L (ref 3.5–5.1)
Sodium: 142 mEq/L (ref 135–145)
TOTAL PROTEIN: 7 g/dL (ref 6.0–8.3)
Total Bilirubin: 0.6 mg/dL (ref 0.2–1.2)

## 2016-12-30 LAB — HEMOGLOBIN A1C: HEMOGLOBIN A1C: 7.6 % — AB (ref 4.6–6.5)

## 2016-12-30 MED ORDER — ROSUVASTATIN CALCIUM 20 MG PO TABS: 20.0000 mg | ORAL_TABLET | Freq: Every day | ORAL | 3 refills | Status: DC

## 2016-12-30 NOTE — Assessment & Plan Note (Signed)
Check A1c.  Continue metformin. 

## 2016-12-30 NOTE — Progress Notes (Signed)
  [name], MD Phone: [phone]  Erin Cook is a 81 y.o. female who presents today for f/u.  DIABETES Disease Monitoring: Blood Sugar ranges-120s Polyuria/phagia/dipsia- no      Visual problems- no Medications: Compliance- taking metformin intermittently Hypoglycemic symptoms- no Was worked on diet by cutting out sweets.  HYPOTHYROIDISM Disease Monitoring Weight changes: no  Skin Changes: no Heat/Cold intolerance: no  Medication Monitoring Compliance:  Taking synthroid   Last TSH:   Lab Results  Component Value Date   TSH 0.71 03/03/2016   HYPERLIPIDEMIA Symptoms Chest pain on exertion:  no   Medications: Compliance- taking simvastatin Right upper quadrant pain- no  Muscle aches- no  She has a history of breast cancer when she was 40. Had bilateral mastectomies. She does have lymphedema in the right arm from lymph node removal. She refuses additional imaging given issues with bursting her implants previously.  She does have a normal-appearing mole on her left mid thoracic back that she just wants to make sure is normal. She will see dermatology.  PMH: nonsmoker.   ROS see history of present illness  Objective  Physical Exam Vitals:   12/30/16 0903  BP: (!) 154/74  Pulse: 67  Temp: 97.8 F (36.6 C)    BP Readings from Last 3 Encounters:  12/30/16 (!) 154/74  11/21/16 138/78  03/03/16 122/72   Wt Readings from Last 3 Encounters:  12/30/16 133 lb (60.3 kg)  11/21/16 135 lb 12.8 oz (61.6 kg)  03/03/16 129 lb 3.2 oz (58.6 kg)    Physical Exam  Constitutional: No distress.  Cardiovascular: Normal rate, regular rhythm and normal heart sounds.   Pulmonary/Chest: Effort normal and breath sounds normal.  Genitourinary:  Genitourinary Comments: Bilateral breasts with postsurgical scars, implants palpable, no noted defects in the right breast, year the nipple of the left breast there are several small pea-sized nodules that appear to be attached to  the implant as the skin is freely mobile over them, they're nontender, patient notes they have been present for some time, no other apparent skin changes, no other skin lesions palpated, no masses palpated, no axillary masses bilaterally  Musculoskeletal: She exhibits no edema.  Neurological: She is alert. Gait normal.  Skin: Skin is warm and dry. She is not diaphoretic.   left mid thoracic back with skin colored nevus   Assessment/Plan: Please see individual problem list.  Diabetes mellitus, type 2 Check A1c. Continue metformin.  Hypothyroidism, postop Check TSH. Continue Synthroid.  History of breast cancer Status post bilateral mastectomy. Does not want to undergo any further imaging for screening. Does not necessarily need mammograms given mastectomy. Exam completed today with a few little nodular irregularities underneath the left nipple that appear to be attached to the implant. We'll send a message to one of our oncology colleagues to determine if any imaging needs to be completed for these.  Hypercholesteremia Continue simvastatin. Check lipid panel.  Benign nevus Nevus appears benign. Encouraged to see her dermatologist to see if they would like to remove it given that she reports it gets irritated.   Orders Placed This Encounter  Procedures  . Lipid Profile  . Comp Met (CMET)  . TSH  . HgB A1c   [name], MD [place]

## 2016-12-30 NOTE — Patient Instructions (Signed)
Nice to see you. Please see your dermatologist for the mole on your back if he would like to have this removed. We'll check lab work and contact you with the results.

## 2016-12-30 NOTE — Assessment & Plan Note (Signed)
Nevus appears benign. Encouraged to see her dermatologist to see if they would like to remove it given that she reports it gets irritated.

## 2016-12-30 NOTE — Assessment & Plan Note (Signed)
Continue simvastatin.  Check lipid panel. 

## 2016-12-30 NOTE — Assessment & Plan Note (Signed)
Status post bilateral mastectomy. Does not want to undergo any further imaging for screening. Does not necessarily need mammograms given mastectomy. Exam completed today with a few little nodular irregularities underneath the left nipple that appear to be attached to the implant. We'll send a message to one of our oncology colleagues to determine if any imaging needs to be completed for these.

## 2016-12-30 NOTE — Assessment & Plan Note (Signed)
Check TSH.  Continue Synthroid. 

## 2017-01-18 DIAGNOSIS — L821 Other seborrheic keratosis: Secondary | ICD-10-CM | POA: Diagnosis not present

## 2017-01-18 DIAGNOSIS — I781 Nevus, non-neoplastic: Secondary | ICD-10-CM | POA: Diagnosis not present

## 2017-01-18 DIAGNOSIS — L570 Actinic keratosis: Secondary | ICD-10-CM | POA: Diagnosis not present

## 2017-01-31 ENCOUNTER — Telehealth: Payer: Self-pay | Admitting: Radiology

## 2017-01-31 ENCOUNTER — Other Ambulatory Visit (INDEPENDENT_AMBULATORY_CARE_PROVIDER_SITE_OTHER): Payer: PPO

## 2017-01-31 DIAGNOSIS — E785 Hyperlipidemia, unspecified: Secondary | ICD-10-CM

## 2017-01-31 LAB — HEPATIC FUNCTION PANEL
ALK PHOS: 51 U/L (ref 39–117)
ALT: 14 U/L (ref 0–35)
AST: 19 U/L (ref 0–37)
Albumin: 4.2 g/dL (ref 3.5–5.2)
BILIRUBIN TOTAL: 0.7 mg/dL (ref 0.2–1.2)
Bilirubin, Direct: 0.1 mg/dL (ref 0.0–0.3)
Total Protein: 6.6 g/dL (ref 6.0–8.3)

## 2017-01-31 LAB — LDL CHOLESTEROL, DIRECT: Direct LDL: 61 mg/dL

## 2017-01-31 NOTE — Telephone Encounter (Signed)
Pt came in for labs today and stated she wanted her A1C checked. Told pt I would ask provider to see if he would add it. Drew lavender tube incase you would like to add.

## 2017-01-31 NOTE — Telephone Encounter (Signed)
Patient does not need an A1c. She just had it done in June. She'll be due for one at the end of September. Thanks.

## 2017-02-02 ENCOUNTER — Other Ambulatory Visit: Payer: Self-pay | Admitting: Family Medicine

## 2017-03-24 DIAGNOSIS — H01009 Unspecified blepharitis unspecified eye, unspecified eyelid: Secondary | ICD-10-CM | POA: Diagnosis not present

## 2017-03-30 DIAGNOSIS — H43822 Vitreomacular adhesion, left eye: Secondary | ICD-10-CM | POA: Diagnosis not present

## 2017-04-03 DIAGNOSIS — H35371 Puckering of macula, right eye: Secondary | ICD-10-CM | POA: Diagnosis not present

## 2017-04-04 ENCOUNTER — Ambulatory Visit (INDEPENDENT_AMBULATORY_CARE_PROVIDER_SITE_OTHER): Payer: PPO | Admitting: Family Medicine

## 2017-04-04 ENCOUNTER — Encounter: Payer: Self-pay | Admitting: Family Medicine

## 2017-04-04 VITALS — BP 150/72 | HR 66 | Temp 98.2°F | Wt 125.0 lb

## 2017-04-04 DIAGNOSIS — E890 Postprocedural hypothyroidism: Secondary | ICD-10-CM | POA: Diagnosis not present

## 2017-04-04 DIAGNOSIS — Z853 Personal history of malignant neoplasm of breast: Secondary | ICD-10-CM | POA: Diagnosis not present

## 2017-04-04 DIAGNOSIS — Z23 Encounter for immunization: Secondary | ICD-10-CM | POA: Diagnosis not present

## 2017-04-04 DIAGNOSIS — E7800 Pure hypercholesterolemia, unspecified: Secondary | ICD-10-CM

## 2017-04-04 DIAGNOSIS — F4321 Adjustment disorder with depressed mood: Secondary | ICD-10-CM | POA: Diagnosis not present

## 2017-04-04 DIAGNOSIS — E119 Type 2 diabetes mellitus without complications: Secondary | ICD-10-CM

## 2017-04-04 LAB — MICROALBUMIN / CREATININE URINE RATIO
CREATININE, U: 36.8 mg/dL
MICROALB/CREAT RATIO: 1.9 mg/g (ref 0.0–30.0)
Microalb, Ur: <0.7 mg/dL (ref 0.0–1.9)

## 2017-04-04 LAB — POCT GLYCOSYLATED HEMOGLOBIN (HGB A1C): Hemoglobin A1C: 7

## 2017-04-04 MED ORDER — METFORMIN HCL 500 MG PO TABS: 500.0000 mg | ORAL_TABLET | Freq: Two times a day (BID) | ORAL | 3 refills | Status: DC

## 2017-04-04 MED ORDER — GLUCOSE BLOOD VI STRP: ORAL_STRIP | 1 refills | Status: DC

## 2017-04-04 NOTE — Assessment & Plan Note (Signed)
Continue Crestor 

## 2017-04-04 NOTE — Assessment & Plan Note (Signed)
Check A1c. Continue metformin. Foot exam performed.

## 2017-04-04 NOTE — Patient Instructions (Signed)
Nice to see you. Please continue working on dietary changes and exercise. We will check an A1c today and call you with the results.

## 2017-04-04 NOTE — Assessment & Plan Note (Signed)
Related to loss of brother. Has support in place. Offered support to her. She'll continue to monitor.

## 2017-04-04 NOTE — Progress Notes (Signed)
  [name], MD Phone: [phone]  Erin Cook is a 81 y.o. female who presents today for follow-up.  Diabetes: Well controlled at home. Taking metformin. No changes in urination or thirst. No hypoglycemia. Has changed her diet quite a bit. Did see a retina specialist recently and follows up with then in January.  Hypothyroidism: Taking Synthroid. Slight weight loss though she has changed her diet quite a bit and cut out sugar and has been exercising. No heat or cold intolerance. No skin changes. Recent TSH normal.  Reports she's been doing with some grief as her brother died in his sleep in 03-05-2023. She's been crying a lot. She notes no depression. She is able to talk to his wife.  Hyperlipidemia: Taking Crestor. No chest pain, right upper quadrant pain, or myalgias. She did have one episode of stomach discomfort though this resolved quickly and has not recurred.  PMH: nonsmoker.   ROS see history of present illness  Objective  Physical Exam Vitals:   04/04/17 0837  BP: (!) 150/72  Pulse: 66  Temp: 98.2 F (36.8 C)  SpO2: 97%    BP Readings from Last 3 Encounters:  04/04/17 (!) 150/72  12/30/16 (!) 154/74  11/21/16 138/78   Wt Readings from Last 3 Encounters:  04/04/17 125 lb (56.7 kg)  12/30/16 133 lb (60.3 kg)  11/21/16 135 lb 12.8 oz (61.6 kg)    Physical Exam  Constitutional: No distress.  Cardiovascular: Normal rate, regular rhythm and normal heart sounds.   Pulmonary/Chest: Effort normal and breath sounds normal.  Musculoskeletal: She exhibits no edema.  Neurological: She is alert. Gait normal.  Skin: She is not diaphoretic.   Diabetic Foot Exam - Simple   Simple Foot Form Diabetic Foot exam was performed with the following findings:  Yes 04/04/2017  9:14 AM  Visual Inspection No deformities, no ulcerations, no other skin breakdown bilaterally:  Yes Sensation Testing Intact to touch and monofilament testing bilaterally:  Yes Pulse  Check Posterior Tibialis and Dorsalis pulse intact bilaterally:  Yes Comments      Assessment/Plan: Please see individual problem list.  Diabetes mellitus, type 2 Check A1c. Continue metformin. Foot exam performed.  Hypothyroidism, postop Asymptomatic. Continue Synthroid.  Hypercholesteremia Continue Crestor.  History of breast cancer Doing well. Prior small nodular irregularities felt to be scar tissue when discussed with oncology. No further imaging needed. She'll continue to monitor.  Grief Related to loss of brother. Has support in place. Offered support to her. She'll continue to monitor.  Reports BP in the 144R systolically at home yesterday. She'll continue to periodically monitor her blood pressure.  Orders Placed This Encounter  Procedures  . Flu vaccine HIGH DOSE PF  . Urine Microalbumin w/creat. ratio  . POCT HgB A1C    Meds ordered this encounter  Medications  . metFORMIN (GLUCOPHAGE) 500 MG tablet    Sig: Take 1 tablet (500 mg total) by mouth 2 (two) times daily with a meal.    Dispense:  180 tablet    Refill:  3  . glucose blood (ONE TOUCH ULTRA TEST) test strip    Sig: Check blood glucose once daily.    Dispense:  100 each    Refill:  [place]iller, MD [place]

## 2017-04-04 NOTE — Assessment & Plan Note (Signed)
Asymptomatic. Continue Synthroid.

## 2017-04-04 NOTE — Assessment & Plan Note (Signed)
Doing well. Prior small nodular irregularities felt to be scar tissue when discussed with oncology. No further imaging needed. She'll continue to monitor.

## 2017-07-03 ENCOUNTER — Ambulatory Visit: Payer: PPO | Admitting: Family Medicine

## 2017-07-10 DIAGNOSIS — R69 Illness, unspecified: Secondary | ICD-10-CM | POA: Diagnosis not present

## 2017-07-12 DIAGNOSIS — R69 Illness, unspecified: Secondary | ICD-10-CM | POA: Diagnosis not present

## 2017-08-01 DIAGNOSIS — H43813 Vitreous degeneration, bilateral: Secondary | ICD-10-CM | POA: Diagnosis not present

## 2017-08-14 ENCOUNTER — Other Ambulatory Visit: Payer: Self-pay | Admitting: Family Medicine

## 2017-09-28 DIAGNOSIS — H43822 Vitreomacular adhesion, left eye: Secondary | ICD-10-CM | POA: Diagnosis not present

## 2017-09-28 LAB — HM DIABETES EYE EXAM

## 2017-10-02 ENCOUNTER — Encounter: Payer: Self-pay | Admitting: Family Medicine

## 2017-10-03 ENCOUNTER — Encounter: Payer: Self-pay | Admitting: Family Medicine

## 2017-10-03 ENCOUNTER — Other Ambulatory Visit: Payer: Self-pay

## 2017-10-03 ENCOUNTER — Ambulatory Visit (INDEPENDENT_AMBULATORY_CARE_PROVIDER_SITE_OTHER): Payer: Medicare HMO | Admitting: Family Medicine

## 2017-10-03 VITALS — BP 130/60 | HR 68 | Temp 97.8°F | Ht 62.0 in | Wt 121.2 lb

## 2017-10-03 DIAGNOSIS — E7800 Pure hypercholesterolemia, unspecified: Secondary | ICD-10-CM

## 2017-10-03 DIAGNOSIS — E119 Type 2 diabetes mellitus without complications: Secondary | ICD-10-CM | POA: Diagnosis not present

## 2017-10-03 DIAGNOSIS — E890 Postprocedural hypothyroidism: Secondary | ICD-10-CM | POA: Diagnosis not present

## 2017-10-03 DIAGNOSIS — Z780 Asymptomatic menopausal state: Secondary | ICD-10-CM

## 2017-10-03 LAB — COMPREHENSIVE METABOLIC PANEL
ALK PHOS: 50 U/L (ref 39–117)
ALT: 13 U/L (ref 0–35)
AST: 24 U/L (ref 0–37)
Albumin: 4.2 g/dL (ref 3.5–5.2)
BILIRUBIN TOTAL: 0.7 mg/dL (ref 0.2–1.2)
BUN: 14 mg/dL (ref 6–23)
CALCIUM: 9.4 mg/dL (ref 8.4–10.5)
CO2: 25 meq/L (ref 19–32)
CREATININE: 0.58 mg/dL (ref 0.40–1.20)
Chloride: 105 mEq/L (ref 96–112)
GFR: 106.49 mL/min (ref >60.00)
GLUCOSE: 113 mg/dL — AB (ref 70–99)
Potassium: 4 mEq/L (ref 3.5–5.1)
Sodium: 141 mEq/L (ref 135–145)
TOTAL PROTEIN: 6.7 g/dL (ref 6.0–8.3)

## 2017-10-03 LAB — LDL CHOLESTEROL, DIRECT: Direct LDL: 60 mg/dL

## 2017-10-03 LAB — TSH: TSH: 0.31 u[IU]/mL — ABNORMAL LOW (ref 0.35–4.50)

## 2017-10-03 LAB — HEMOGLOBIN A1C: Hgb A1c MFr Bld: 6.9 % — ABNORMAL HIGH (ref 4.6–6.5)

## 2017-10-03 MED ORDER — ROSUVASTATIN CALCIUM 20 MG PO TABS: 20.0000 mg | ORAL_TABLET | Freq: Every day | ORAL | 3 refills | Status: DC

## 2017-10-03 MED ORDER — LEVOTHYROXINE SODIUM 88 MCG PO TABS: 88.0000 ug | ORAL_TABLET | Freq: Every day | ORAL | 1 refills | Status: DC

## 2017-10-03 NOTE — Assessment & Plan Note (Signed)
Postop hypothyroidism due to multinodular goiter.  She reports no thyroid cancer.  Check TSH.  Continue Synthroid.

## 2017-10-03 NOTE — Assessment & Plan Note (Signed)
Doing well.  Check A1c.  Continue metformin.

## 2017-10-03 NOTE — Patient Instructions (Signed)
Nice to see you. We will check lab work today and contact you with the results. If you would like to do the bone density scan please let us know.

## 2017-10-03 NOTE — Progress Notes (Signed)
  [name], MD Phone: [phone]  Erin Cook is a 82 y.o. female who presents today for f/u.  DIABETES Disease Monitoring: Blood Sugar ranges-120s Polyuria/phagia/dipsia- no      Optho- UTD Medications: Compliance- taking metformin Hypoglycemic symptoms- no  HYPOTHYROIDISM Disease Monitoring Weight changes: no  Skin Changes: no Heat/Cold intolerance: notes her hands feel cool at times, though not to touch  Medication Monitoring Compliance:  Taking synthroid   Last TSH:   Lab Results  Component Value Date   TSH 0.31 (L) 10/03/2017   HYPERLIPIDEMIA Symptoms Chest pain on exertion:  no   Dyspnea: no Medications: Compliance- taking crestor Right upper quadrant pain- no  Muscle aches- no  Going to have a cataract removed in June.   Social History   Tobacco Use  Smoking Status Never Smoker  Smokeless Tobacco Never Used     ROS see history of present illness  Objective  Physical Exam Vitals:   10/03/17 1028  BP: 130/60  Pulse: 68  Temp: 97.8 F (36.6 C)  SpO2: 98%    BP Readings from Last 3 Encounters:  10/03/17 130/60  04/04/17 (!) 150/72  12/30/16 (!) 154/74   Wt Readings from Last 3 Encounters:  10/03/17 121 lb 3.2 oz (55 kg)  04/04/17 125 lb (56.7 kg)  12/30/16 133 lb (60.3 kg)    Physical Exam  Constitutional: No distress.  Cardiovascular: Normal rate, regular rhythm and normal heart sounds.  Bilateral hands warm and well perfused with good capillary refill, 2+ radial pulses  Pulmonary/Chest: Effort normal and breath sounds normal.  Musculoskeletal: She exhibits no edema.  Neurological: She is alert. Gait normal.  Skin: Skin is warm and dry. She is not diaphoretic.     Assessment/Plan: Please see individual problem list.  Hypothyroidism, postop Postop hypothyroidism due to multinodular goiter.  She reports no thyroid cancer.  Check TSH.  Continue Synthroid.  Diabetes mellitus, type 2 Doing well.  Check A1c.  Continue  metformin.  Hypercholesteremia Continue Crestor.  Check direct LDL   Health Maintenance: She will contact us if she would like to do the DEXA scan  Orders Placed This Encounter  Procedures  . Direct LDL  . Comp Met (CMET)  . Hemoglobin A1c  . TSH    Meds ordered this encounter  Medications  . levothyroxine (SYNTHROID, LEVOTHROID) 88 MCG tablet    Sig: Take 1 tablet (88 mcg total) by mouth daily with breakfast.    Dispense:  90 tablet    Refill:  1    This prescription was filled on 05/16/2017. Any refills authorized will be placed on file.  . rosuvastatin (CRESTOR) 20 MG tablet    Sig: Take 1 tablet (20 mg total) by mouth daily.    Dispense:  90 tablet    Refill:  Ages, MD [place]

## 2017-10-03 NOTE — Assessment & Plan Note (Signed)
Continue Crestor.  Check direct LDL

## 2017-10-10 DIAGNOSIS — R69 Illness, unspecified: Secondary | ICD-10-CM | POA: Diagnosis not present

## 2017-11-15 ENCOUNTER — Telehealth: Payer: Self-pay | Admitting: Radiology

## 2017-11-15 DIAGNOSIS — E039 Hypothyroidism, unspecified: Secondary | ICD-10-CM

## 2017-11-15 NOTE — Addendum Note (Signed)
Addended by: Leone Haven on: 11/15/2017 08:59 AM   Modules accepted: Orders

## 2017-11-15 NOTE — Telephone Encounter (Signed)
Pt coming in for labs tomorrow, please place future orders. Thank you.  

## 2017-11-16 ENCOUNTER — Other Ambulatory Visit: Payer: Self-pay | Admitting: Family Medicine

## 2017-11-16 ENCOUNTER — Other Ambulatory Visit (INDEPENDENT_AMBULATORY_CARE_PROVIDER_SITE_OTHER): Payer: Medicare HMO

## 2017-11-16 DIAGNOSIS — E039 Hypothyroidism, unspecified: Secondary | ICD-10-CM

## 2017-11-16 LAB — TSH: TSH: 0.11 u[IU]/mL — ABNORMAL LOW (ref 0.35–4.50)

## 2017-11-16 MED ORDER — LEVOTHYROXINE SODIUM 75 MCG PO TABS: 75.0000 ug | ORAL_TABLET | Freq: Every day | ORAL | 1 refills | Status: DC

## 2017-11-22 ENCOUNTER — Ambulatory Visit: Payer: PPO

## 2017-11-28 DIAGNOSIS — H2512 Age-related nuclear cataract, left eye: Secondary | ICD-10-CM | POA: Diagnosis not present

## 2017-12-01 DIAGNOSIS — D492 Neoplasm of unspecified behavior of bone, soft tissue, and skin: Secondary | ICD-10-CM | POA: Diagnosis not present

## 2017-12-01 DIAGNOSIS — L814 Other melanin hyperpigmentation: Secondary | ICD-10-CM | POA: Diagnosis not present

## 2017-12-01 DIAGNOSIS — L570 Actinic keratosis: Secondary | ICD-10-CM | POA: Diagnosis not present

## 2017-12-08 NOTE — Discharge Instructions (Signed)

## 2017-12-11 ENCOUNTER — Ambulatory Visit
Admission: RE | Admit: 2017-12-11 | Discharge: 2017-12-11 | Disposition: A | Discharge status: Home / Self Care | Payer: Medicare HMO | Source: Ambulatory Visit | Attending: Ophthalmology | Admitting: Ophthalmology

## 2017-12-11 ENCOUNTER — Ambulatory Visit: Payer: Medicare HMO | Admitting: Anesthesiology

## 2017-12-11 ENCOUNTER — Encounter
Admission: RE | Disposition: A | Discharge status: Home / Self Care | Payer: Self-pay | Source: Ambulatory Visit | Attending: Ophthalmology

## 2017-12-11 DIAGNOSIS — H2512 Age-related nuclear cataract, left eye: Secondary | ICD-10-CM | POA: Insufficient documentation

## 2017-12-11 DIAGNOSIS — K219 Gastro-esophageal reflux disease without esophagitis: Secondary | ICD-10-CM | POA: Insufficient documentation

## 2017-12-11 DIAGNOSIS — E039 Hypothyroidism, unspecified: Secondary | ICD-10-CM | POA: Diagnosis not present

## 2017-12-11 DIAGNOSIS — Z79899 Other long term (current) drug therapy: Secondary | ICD-10-CM | POA: Diagnosis not present

## 2017-12-11 DIAGNOSIS — E119 Type 2 diabetes mellitus without complications: Secondary | ICD-10-CM | POA: Diagnosis not present

## 2017-12-11 DIAGNOSIS — E7800 Pure hypercholesterolemia, unspecified: Secondary | ICD-10-CM | POA: Diagnosis not present

## 2017-12-11 DIAGNOSIS — Z7984 Long term (current) use of oral hypoglycemic drugs: Secondary | ICD-10-CM | POA: Diagnosis not present

## 2017-12-11 DIAGNOSIS — H25812 Combined forms of age-related cataract, left eye: Secondary | ICD-10-CM | POA: Diagnosis not present

## 2017-12-11 HISTORY — DX: Unspecified hearing loss, unspecified ear: H91.90

## 2017-12-11 HISTORY — PX: CATARACT EXTRACTION W/PHACO: SHX586

## 2017-12-11 HISTORY — DX: Unspecified osteoarthritis, unspecified site: M19.90

## 2017-12-11 HISTORY — DX: Motion sickness, initial encounter: T75.3XXA

## 2017-12-11 HISTORY — DX: Hypothyroidism, unspecified: E03.9

## 2017-12-11 HISTORY — DX: Type 2 diabetes mellitus without complications: E11.9

## 2017-12-11 HISTORY — DX: Gastro-esophageal reflux disease without esophagitis: K21.9

## 2017-12-11 LAB — GLUCOSE, CAPILLARY
GLUCOSE-CAPILLARY: 131 mg/dL — AB (ref 65–99)
Glucose-Capillary: 114 mg/dL — ABNORMAL HIGH (ref 65–99)

## 2017-12-11 SURGERY — PHACOEMULSIFICATION, CATARACT, WITH IOL INSERTION
Anesthesia: Monitor Anesthesia Care | Site: Eye | Laterality: Left | Wound class: Clean

## 2017-12-11 MED ORDER — ACETAMINOPHEN 325 MG PO TABS: 325.0000 mg | ORAL_TABLET | Freq: Once | ORAL | Status: DC

## 2017-12-11 MED ORDER — NA HYALUR & NA CHOND-NA HYALUR 0.4-0.35 ML IO KIT
PACK | INTRAOCULAR | Status: DC | PRN
  Administered 2017-12-11: 1 mL via INTRAOCULAR

## 2017-12-11 MED ORDER — MIDAZOLAM HCL 2 MG/2ML IJ SOLN
INTRAMUSCULAR | Status: DC | PRN
  Administered 2017-12-11: 1 mg via INTRAVENOUS

## 2017-12-11 MED ORDER — MOXIFLOXACIN HCL 0.5 % OP SOLN
1.0000 [drp] | OPHTHALMIC | Status: DC | PRN
  Administered 2017-12-11 (×3): 1 [drp] via OPHTHALMIC

## 2017-12-11 MED ORDER — LACTATED RINGERS IV SOLN: INTRAVENOUS | Status: DC

## 2017-12-11 MED ORDER — CEFUROXIME OPHTHALMIC INJECTION 1 MG/0.1 ML
INJECTION | OPHTHALMIC | Status: DC | PRN
  Administered 2017-12-11: .3 mL via OPHTHALMIC

## 2017-12-11 MED ORDER — LIDOCAINE HCL (PF) 2 % IJ SOLN
INTRAOCULAR | Status: DC | PRN
  Administered 2017-12-11: 1 mL via INTRAMUSCULAR

## 2017-12-11 MED ORDER — ACETAMINOPHEN 160 MG/5ML PO SOLN: 325.0000 mg | Freq: Once | ORAL | Status: DC

## 2017-12-11 MED ORDER — BRIMONIDINE TARTRATE-TIMOLOL 0.2-0.5 % OP SOLN
OPHTHALMIC | Status: DC | PRN
  Administered 2017-12-11: 2 [drp] via OPHTHALMIC

## 2017-12-11 MED ORDER — FENTANYL CITRATE (PF) 100 MCG/2ML IJ SOLN
INTRAMUSCULAR | Status: DC | PRN
  Administered 2017-12-11: 50 ug via INTRAVENOUS

## 2017-12-11 MED ORDER — LACTATED RINGERS IV SOLN: 10.0000 mL/h | INTRAVENOUS | Status: DC

## 2017-12-11 MED ORDER — ARMC OPHTHALMIC DILATING DROPS
1.0000 "application " | OPHTHALMIC | Status: DC | PRN
  Administered 2017-12-11 (×3): 1 via OPHTHALMIC

## 2017-12-11 MED ORDER — EPINEPHRINE PF 1 MG/ML IJ SOLN
INTRAOCULAR | Status: DC | PRN
  Administered 2017-12-11: 64 mL via OPHTHALMIC

## 2017-12-11 SURGICAL SUPPLY — 27 items

## 2017-12-11 NOTE — Transfer of Care (Signed)
Immediate Anesthesia Transfer of Care Note  Patient: Erin Cook  Procedure(s) Performed: CATARACT EXTRACTION PHACO AND INTRAOCULAR LENS PLACEMENT (IOC)  LEFT DIABETIC (Left Eye)  Patient Location: PACU  Anesthesia Type: MAC  Level of Consciousness: awake, alert  and patient cooperative  Airway and Oxygen Therapy: Patient Spontanous Breathing and Patient connected to supplemental oxygen  Post-op Assessment: Post-op Vital signs reviewed, Patient's Cardiovascular Status Stable, Respiratory Function Stable, Patent Airway and No signs of Nausea or vomiting  Post-op Vital Signs: Reviewed and stable  Complications: No apparent anesthesia complications

## 2017-12-11 NOTE — Anesthesia Procedure Notes (Signed)
Procedure Name: MAC Date/Time: 12/11/2017 8:22 AM Performed by: Cameron Ali, CRNA Pre-anesthesia Checklist: Patient identified, Emergency Drugs available, Suction available, Timeout performed and Patient being monitored Patient Re-evaluated:Patient Re-evaluated prior to induction Oxygen Delivery Method: Nasal cannula Placement Confirmation: positive ETCO2

## 2017-12-11 NOTE — Anesthesia Postprocedure Evaluation (Signed)
Anesthesia Post Note  Patient: Erin Cook  Procedure(s) Performed: CATARACT EXTRACTION PHACO AND INTRAOCULAR LENS PLACEMENT (Purvi Ruehl)  LEFT DIABETIC (Left Eye)  Patient location during evaluation: PACU Anesthesia Type: MAC Level of consciousness: awake and alert and oriented Pain management: satisfactory to patient Vital Signs Assessment: post-procedure vital signs reviewed and stable Respiratory status: spontaneous breathing, nonlabored ventilation and respiratory function stable Cardiovascular status: blood pressure returned to baseline and stable Postop Assessment: Adequate PO intake and No signs of nausea or vomiting Anesthetic complications: no    Raliegh Ip

## 2017-12-11 NOTE — Anesthesia Preprocedure Evaluation (Signed)
Anesthesia Evaluation  Patient identified by MRN, date of birth, ID band Patient awake    Reviewed: Allergy & Precautions, H&P , NPO status , Patient's Chart, lab work & pertinent test results  Airway Mallampati: II  TM Distance: >3 FB Neck ROM: full    Dental no notable dental hx.    Pulmonary    Pulmonary exam normal breath sounds clear to auscultation       Cardiovascular Normal cardiovascular exam Rhythm:regular Rate:Normal     Neuro/Psych    GI/Hepatic GERD  ,  Endo/Other  diabetesHypothyroidism   Renal/GU      Musculoskeletal   Abdominal   Peds  Hematology   Anesthesia Other Findings   Reproductive/Obstetrics                             Anesthesia Physical Anesthesia Plan  ASA: I  Anesthesia Plan: MAC   Post-op Pain Management:    Induction:   PONV Risk Score and Plan: 2 and Midazolam and Treatment may vary due to age or medical condition  Airway Management Planned:   Additional Equipment:   Intra-op Plan:   Post-operative Plan:   Informed Consent: I have reviewed the patients History and Physical, chart, labs and discussed the procedure including the risks, benefits and alternatives for the proposed anesthesia with the patient or authorized representative who has indicated his/her understanding and acceptance.     Plan Discussed with: CRNA  Anesthesia Plan Comments:         Anesthesia Quick Evaluation

## 2017-12-11 NOTE — Op Note (Signed)
OPERATIVE NOTE  Erin Cook [id number] 12/11/2017   PREOPERATIVE DIAGNOSIS:  Nuclear sclerotic cataract left eye. H25.12   POSTOPERATIVE DIAGNOSIS:    Nuclear sclerotic cataract left eye.     PROCEDURE:  Phacoemusification with posterior chamber intraocular lens placement of the left eye   LENS:   Implant Name Type Inv. Item Serial No. Manufacturer Lot No. LRB No. Used  LENS IOL DIOP 26.5 - [id number] Intraocular Lens LENS IOL DIOP 26.5 [id number]  Left 1        ULTRASOUND TIME: 14  % of 1 minutes 13 seconds, CDE 10.0  SURGEON:  Wyonia Hough, MD   ANESTHESIA:  Topical with tetracaine drops and 2% Xylocaine jelly, augmented with 1% preservative-free intracameral lidocaine.    COMPLICATIONS:  None.   DESCRIPTION OF PROCEDURE:  The patient was identified in the holding room and transported to the operating room and placed in the supine position under the operating microscope.  The left eye was identified as the operative eye and it was prepped and draped in the usual sterile ophthalmic fashion.   A 1 millimeter clear-corneal paracentesis was made at the 1:30 position.  0.5 ml of preservative-free 1% lidocaine was injected into the anterior chamber.  The anterior chamber was filled with Viscoat viscoelastic.  A 2.4 millimeter keratome was used to make a near-clear corneal incision at the 10:30 position.  .  A curvilinear capsulorrhexis was made with a cystotome and capsulorrhexis forceps.  Balanced salt solution was used to hydrodissect and hydrodelineate the nucleus.   Phacoemulsification was then used in stop and chop fashion to remove the lens nucleus and epinucleus.  The remaining cortex was then removed using the irrigation and aspiration handpiece. Provisc was then placed into the capsular bag to distend it for lens placement.  A lens was then injected into the capsular bag.  The remaining viscoelastic was aspirated.   Wounds were hydrated with balanced salt  solution.  The anterior chamber was inflated to a physiologic pressure with balanced salt solution.  No wound leaks were noted. Cefuroxime 0.1 ml of a 10mg /ml solution was injected into the anterior chamber for a dose of 1 mg of intracameral antibiotic at the completion of the case.   Timolol and Brimonidine drops were applied to the eye.  The patient was taken to the recovery room in stable condition without complications of anesthesia or surgery.  [name] 12/11/2017, 8:38 AM

## 2017-12-11 NOTE — H&P (Signed)
The History and Physical notes are on paper, have been signed, and are to be scanned. The patient remains stable and unchanged from the H&P.   Previous H&P reviewed, patient examined, and there are no changes.  Erin Cook 12/11/2017 7:39 AM'

## 2017-12-12 ENCOUNTER — Encounter: Payer: Self-pay | Admitting: Ophthalmology

## 2017-12-12 DIAGNOSIS — I10 Essential (primary) hypertension: Secondary | ICD-10-CM | POA: Diagnosis not present

## 2017-12-12 DIAGNOSIS — L03116 Cellulitis of left lower limb: Secondary | ICD-10-CM | POA: Diagnosis not present

## 2017-12-25 ENCOUNTER — Other Ambulatory Visit (INDEPENDENT_AMBULATORY_CARE_PROVIDER_SITE_OTHER): Payer: Medicare HMO

## 2017-12-25 DIAGNOSIS — E039 Hypothyroidism, unspecified: Secondary | ICD-10-CM | POA: Diagnosis not present

## 2017-12-25 LAB — TSH: TSH: 0.18 u[IU]/mL — AB (ref 0.35–4.50)

## 2017-12-29 ENCOUNTER — Telehealth: Payer: Self-pay

## 2017-12-29 DIAGNOSIS — E039 Hypothyroidism, unspecified: Secondary | ICD-10-CM

## 2017-12-29 NOTE — Telephone Encounter (Signed)
-----   Message from Leone Haven, MD sent at 12/28/2017  5:03 PM EDT ----- Biotin can affect thyroid function testing.  She should discontinue that and we should recheck her TSH in 2 to 3 weeks.  Please place an order for TSH for hypothyroidism.  Thanks.

## 2018-01-04 ENCOUNTER — Other Ambulatory Visit: Payer: Self-pay | Admitting: Family Medicine

## 2018-01-05 DIAGNOSIS — R69 Illness, unspecified: Secondary | ICD-10-CM | POA: Diagnosis not present

## 2018-01-19 ENCOUNTER — Other Ambulatory Visit (INDEPENDENT_AMBULATORY_CARE_PROVIDER_SITE_OTHER): Payer: Medicare HMO

## 2018-01-19 DIAGNOSIS — E039 Hypothyroidism, unspecified: Secondary | ICD-10-CM

## 2018-01-19 LAB — TSH: TSH: 0.22 u[IU]/mL — AB (ref 0.35–4.50)

## 2018-01-24 ENCOUNTER — Telehealth: Payer: Self-pay | Admitting: Family Medicine

## 2018-01-24 NOTE — Telephone Encounter (Signed)
Copied from [place] [id number]. Topic: Quick Communication - Lab Results >> Jan 22, 2018  4:11 PM Juanda Chance, [place] wrote: Left message to return call, ok for [facility] to give results and speak to patient  >> Jan 23, 2018  9:33 AM Percell Belt A wrote: Pt called back about lab results, all nurses on other line >> Jan 24, 2018  4:58 PM Waylan Rocher, [place] wrote: No [facility] RN free for results.

## 2018-01-24 NOTE — Telephone Encounter (Signed)
Charted in result notes. 

## 2018-01-25 ENCOUNTER — Other Ambulatory Visit: Payer: Self-pay | Admitting: Family Medicine

## 2018-01-25 DIAGNOSIS — E039 Hypothyroidism, unspecified: Secondary | ICD-10-CM

## 2018-01-25 MED ORDER — LEVOTHYROXINE SODIUM 50 MCG PO TABS: 50.0000 ug | ORAL_TABLET | Freq: Every day | ORAL | 2 refills | Status: DC

## 2018-01-30 DIAGNOSIS — R69 Illness, unspecified: Secondary | ICD-10-CM | POA: Diagnosis not present

## 2018-01-31 ENCOUNTER — Ambulatory Visit (INDEPENDENT_AMBULATORY_CARE_PROVIDER_SITE_OTHER): Payer: Medicare HMO

## 2018-01-31 ENCOUNTER — Encounter: Payer: Self-pay | Admitting: Family

## 2018-01-31 ENCOUNTER — Ambulatory Visit (INDEPENDENT_AMBULATORY_CARE_PROVIDER_SITE_OTHER): Payer: Medicare HMO | Admitting: Family

## 2018-01-31 VITALS — BP 162/70 | HR 69 | Temp 98.1°F | Wt 119.2 lb

## 2018-01-31 DIAGNOSIS — M25561 Pain in right knee: Secondary | ICD-10-CM

## 2018-01-31 DIAGNOSIS — S8991XA Unspecified injury of right lower leg, initial encounter: Secondary | ICD-10-CM | POA: Diagnosis not present

## 2018-01-31 MED ORDER — MELOXICAM 7.5 MG PO TABS: 7.5000 mg | ORAL_TABLET | Freq: Every day | ORAL | 0 refills | Status: DC

## 2018-01-31 NOTE — Patient Instructions (Signed)
Start mobic for knee pain. Take with food . Stop ALL other antiinflammatories- ibuprofen, aleve.   Ice at least 2x per day for 20 minutes.   If not better, please me know so we can consult orthopedics.
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
## 2018-02-01 NOTE — Progress Notes (Signed)
Called patient back , advised her to monitor blood pressure at home and call back with reading . She states blood pressure usually runs 121/68 at home.   She states mobic is making her  a little dizzy however she is taking with food.  However she thinks she didn't eat enough only ate with small bowl of oatmeal will try taking with bigger meal.   Knee pain is better today able to rest.

## 2018-02-09 ENCOUNTER — Encounter: Payer: Self-pay | Admitting: Family

## 2018-02-27 ENCOUNTER — Other Ambulatory Visit: Payer: Self-pay | Admitting: Family

## 2018-02-27 DIAGNOSIS — M25561 Pain in right knee: Secondary | ICD-10-CM

## 2018-02-28 NOTE — Telephone Encounter (Signed)
Last refill 01/31/18 Last office visit 01/31/18 Next office visit 04/04/18 Dr Caryl Bis

## 2018-02-28 NOTE — Telephone Encounter (Signed)
Call pt Advise that if she still needs need mobic for knee pain, she needs to discuss with PCP whether consult with ortho appropriate.   I refilled mobic for one month

## 2018-03-20 DIAGNOSIS — R69 Illness, unspecified: Secondary | ICD-10-CM | POA: Diagnosis not present

## 2018-03-20 NOTE — Telephone Encounter (Signed)
Tried calling patient --no answer

## 2018-03-26 NOTE — Telephone Encounter (Signed)
Received another request from [facility] , patient did not request medication.  She will discuss at office visit with Dr Caryl Bis

## 2018-04-02 DIAGNOSIS — I10 Essential (primary) hypertension: Secondary | ICD-10-CM | POA: Diagnosis not present

## 2018-04-02 DIAGNOSIS — E782 Mixed hyperlipidemia: Secondary | ICD-10-CM | POA: Diagnosis not present

## 2018-04-04 ENCOUNTER — Ambulatory Visit (INDEPENDENT_AMBULATORY_CARE_PROVIDER_SITE_OTHER): Payer: Medicare HMO | Admitting: Family Medicine

## 2018-04-04 ENCOUNTER — Encounter: Payer: Self-pay | Admitting: Family Medicine

## 2018-04-04 VITALS — BP 130/70 | HR 65 | Temp 98.1°F | Ht 63.0 in | Wt 119.2 lb

## 2018-04-04 DIAGNOSIS — E890 Postprocedural hypothyroidism: Secondary | ICD-10-CM

## 2018-04-04 DIAGNOSIS — M1711 Unilateral primary osteoarthritis, right knee: Secondary | ICD-10-CM

## 2018-04-04 DIAGNOSIS — F4321 Adjustment disorder with depressed mood: Secondary | ICD-10-CM

## 2018-04-04 DIAGNOSIS — D1801 Hemangioma of skin and subcutaneous tissue: Secondary | ICD-10-CM | POA: Diagnosis not present

## 2018-04-04 DIAGNOSIS — E119 Type 2 diabetes mellitus without complications: Secondary | ICD-10-CM | POA: Diagnosis not present

## 2018-04-04 DIAGNOSIS — E7800 Pure hypercholesterolemia, unspecified: Secondary | ICD-10-CM

## 2018-04-04 DIAGNOSIS — R69 Illness, unspecified: Secondary | ICD-10-CM | POA: Diagnosis not present

## 2018-04-04 LAB — COMPREHENSIVE METABOLIC PANEL
ALT: 13 U/L (ref 0–35)
AST: 21 U/L (ref 0–37)
Albumin: 4.6 g/dL (ref 3.5–5.2)
Alkaline Phosphatase: 54 U/L (ref 39–117)
BUN: 12 mg/dL (ref 6–23)
CALCIUM: 9.7 mg/dL (ref 8.4–10.5)
CHLORIDE: 104 meq/L (ref 96–112)
CO2: 29 mEq/L (ref 19–32)
Creatinine, Ser: 0.69 mg/dL (ref 0.40–1.20)
GFR: 87.04 mL/min (ref >60.00)
Glucose, Bld: 116 mg/dL — ABNORMAL HIGH (ref 70–99)
POTASSIUM: 4 meq/L (ref 3.5–5.1)
Sodium: 143 mEq/L (ref 135–145)
Total Bilirubin: 0.7 mg/dL (ref 0.2–1.2)
Total Protein: 6.9 g/dL (ref 6.0–8.3)

## 2018-04-04 LAB — MICROALBUMIN / CREATININE URINE RATIO
CREATININE, U: 94.3 mg/dL
MICROALB/CREAT RATIO: 0.9 mg/g (ref 0.0–30.0)
Microalb, Ur: 0.8 mg/dL (ref 0.0–1.9)

## 2018-04-04 LAB — CBC
HCT: 42.5 % (ref 36.0–46.0)
Hemoglobin: 14.3 g/dL (ref 12.0–15.0)
MCHC: 33.6 g/dL (ref 30.0–36.0)
MCV: 99.1 fl (ref 78.0–100.0)
Platelets: 245 10*3/uL (ref 150.0–400.0)
RBC: 4.29 Mil/uL (ref 3.87–5.11)
RDW: 13.9 % (ref 11.5–15.5)
WBC: 9 10*3/uL (ref 4.0–10.5)

## 2018-04-04 LAB — LIPID PANEL
CHOLESTEROL: 144 mg/dL (ref 0–200)
HDL: 63.6 mg/dL (ref >39.00)
LDL CALC: 63 mg/dL (ref 0–99)
NonHDL: 80.45
TRIGLYCERIDES: 85 mg/dL (ref 0.0–149.0)
Total CHOL/HDL Ratio: 2
VLDL: 17 mg/dL (ref 0.0–40.0)

## 2018-04-04 LAB — HEMOGLOBIN A1C: HEMOGLOBIN A1C: 7.2 % — AB (ref 4.6–6.5)

## 2018-04-04 LAB — TSH: TSH: 5.41 u[IU]/mL — AB (ref 0.35–4.50)

## 2018-04-04 NOTE — Patient Instructions (Signed)
Nice to see you. We will get some lab work today and contact you with the results. We will refer you to Dr. Marry Guan.

## 2018-04-04 NOTE — Assessment & Plan Note (Signed)
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  We will check a CBC.  She will monitor.

## 2018-04-04 NOTE — Assessment & Plan Note (Signed)
Related to loss of brother and sister over the last year or so.  Progressively improving.  She did lose some weight initially with this though has stabilized.  Discussed doing a breast exam given her history of breast cancer though she deferred.  She deferred mammogram.  Colon cancer screening up-to-date.  She did have a breast exam last year that was felt to be benign.  I suspect the weight loss is related to grief and her excessive dose of Synthroid.  She has stabilized and will continue to monitor.  We will evaluate her thyroid.

## 2018-04-04 NOTE — Assessment & Plan Note (Signed)
Arthritis in her right knee.  Continues to have symptoms intermittently.  Will refer to orthopedics for evaluation.

## 2018-04-04 NOTE — Assessment & Plan Note (Addendum)
Check TSH.  Continue Synthroid. 

## 2018-04-04 NOTE — Progress Notes (Signed)
Erin Rumps, MD Phone: [phone]  Erin Cook is a 82 y.o. female who presents today for f/u.  CC: Hypothyroidism, right knee and hip pain, weight loss, diabetes, grief, hemangiomas  HYPOTHYROIDISM Disease Monitoring Weight changes: weight loss previously, though has stabilized  Skin Changes: onset of hemangiomas Heat/Cold intolerance: no  Medication Monitoring Compliance:  Taking synthroid   Last TSH:   Lab Results  Component Value Date   TSH 0.22 (L) 01/19/2018   DIABETES Disease Monitoring: Blood Sugar ranges-not checking Polyuria/phagia/dipsia- no      Optho- UTD Medications: Compliance- taking metformin Hypoglycemic symptoms- no  Grief: Patient notes she lost her younger sister in a car accident and her brother passed away.  These things occurred in the last year or so.  She will dream and cannot fall back asleep when she dreams of them.  She notes she is improving.  Notes it is grief.  She misses them.  No depression or anxiety.  Notes not having much of an appetite at times possibly related to this.  She did lose some weight though has stabilized.  No drenching night sweats.  No itching.  No blood in her stool.  No abdominal pain.  No diarrhea.  No nausea or vomiting.  No breast changes.  She does have a history of breast cancer though.  Right knee and hip pain: Patient notes she twisted it at church.  She did not fall.  She used meloxicam with some benefit for 10 days and did another 10-day course.  Notes it did lock up on her recently.  Sometimes has difficulty putting pressure on it.  Uses topical treatments as well.  Would like a referral to orthopedics.  Patient reports several apparent hemangiomas on her bilateral upper arms that have been present for several months.  She notes no bleeding issues.  She thinks it may be related to metformin.   Social History   Tobacco Use  Smoking Status Never Smoker  Smokeless Tobacco Never Used     ROS see history of  present illness  Objective  Physical Exam Vitals:   04/04/18 0854  BP: 130/70  Pulse: 65  Temp: 98.1 F (36.7 C)  SpO2: 98%    BP Readings from Last 3 Encounters:  04/04/18 130/70  01/31/18 (!) 162/70  12/11/17 (!) 150/59   Wt Readings from Last 3 Encounters:  04/04/18 119 lb 3.2 oz (54.1 kg)  01/31/18 119 lb 4 oz (54.1 kg)  12/11/17 119 lb (54 kg)    Physical Exam  Constitutional: No distress.  Cardiovascular: Normal rate, regular rhythm and normal heart sounds.  Pulmonary/Chest: Effort normal and breath sounds normal.  Musculoskeletal: She exhibits no edema.  Right knee with no warmth, erythema, tenderness, or swelling, there is discomfort on McMurray's, no ligamentous laxity, left knee with no warmth, erythema, tenderness, or swelling, no discomfort on McMurray's, no ligamentous laxity, full internal and external range of motion bilateral hips with no discomfort  Neurological: She is alert.  Skin: Skin is warm and dry. She is not diaphoretic.  Scattered benign-appearing hemangiomas on her bilateral upper and her arms     Assessment/Plan: Please see individual problem list.  Diabetes mellitus, type 2 Check A1c.  Continue metformin.  Reassured patient that the hemangiomas are not listed as a side effect of metformin.  Hypothyroidism, postop Check TSH.  Continue Synthroid.  Grief Related to loss of brother and sister over the last year or so.  Progressively improving.  She did lose some  weight initially with this though has stabilized.  Discussed doing a breast exam given her history of breast cancer though she deferred.  She deferred mammogram.  Colon cancer screening up-to-date.  She did have a breast exam last year that was felt to be benign.  I suspect the weight loss is related to grief and her excessive dose of Synthroid.  She has stabilized and will continue to monitor.  We will evaluate her thyroid.  Hypercholesteremia Check lipid panel.  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  We will check a CBC.  She will monitor.  Arthritis, degenerative Arthritis in her right knee.  Continues to have symptoms intermittently.  Will refer to orthopedics for evaluation.   Orders Placed This Encounter  Procedures  . Lipid panel  . Comp Met (CMET)  . TSH  . HgB A1c  . Urine Microalbumin w/creat. ratio  . CBC  . Ambulatory referral to Orthopedic Surgery    Referral Priority:   Routine    Referral Type:   Surgical    Referral Reason:   Specialty Services Required    Requested Specialty:   Orthopedic Surgery    Number of Visits Requested:   1    No orders of the defined types were placed in this encounter.    Erin Rumps, MD [place]

## 2018-04-04 NOTE — Assessment & Plan Note (Signed)
Check A1c.  Continue metformin.  Reassured patient that the hemangiomas are not listed as a side effect of metformin.

## 2018-04-04 NOTE — Assessment & Plan Note (Signed)
Check lipid panel  

## 2018-04-05 ENCOUNTER — Other Ambulatory Visit: Payer: Self-pay | Admitting: Family Medicine

## 2018-04-05 DIAGNOSIS — E039 Hypothyroidism, unspecified: Secondary | ICD-10-CM

## 2018-04-05 DIAGNOSIS — R69 Illness, unspecified: Secondary | ICD-10-CM | POA: Diagnosis not present

## 2018-04-05 MED ORDER — LEVOTHYROXINE SODIUM 25 MCG PO TABS: 62.5000 ug | ORAL_TABLET | Freq: Every day | ORAL | 1 refills | Status: DC

## 2018-04-10 DIAGNOSIS — G5620 Lesion of ulnar nerve, unspecified upper limb: Secondary | ICD-10-CM | POA: Insufficient documentation

## 2018-04-10 DIAGNOSIS — M1711 Unilateral primary osteoarthritis, right knee: Secondary | ICD-10-CM | POA: Diagnosis not present

## 2018-04-10 DIAGNOSIS — G5600 Carpal tunnel syndrome, unspecified upper limb: Secondary | ICD-10-CM | POA: Insufficient documentation

## 2018-04-10 DIAGNOSIS — M75120 Complete rotator cuff tear or rupture of unspecified shoulder, not specified as traumatic: Secondary | ICD-10-CM | POA: Insufficient documentation

## 2018-04-16 ENCOUNTER — Encounter: Payer: Self-pay | Admitting: Family Medicine

## 2018-04-18 ENCOUNTER — Other Ambulatory Visit: Payer: Self-pay | Admitting: Family Medicine

## 2018-04-18 DIAGNOSIS — E039 Hypothyroidism, unspecified: Secondary | ICD-10-CM

## 2018-06-05 ENCOUNTER — Other Ambulatory Visit (INDEPENDENT_AMBULATORY_CARE_PROVIDER_SITE_OTHER): Payer: Medicare HMO

## 2018-06-05 DIAGNOSIS — E039 Hypothyroidism, unspecified: Secondary | ICD-10-CM

## 2018-06-05 LAB — TSH: TSH: 1.94 u[IU]/mL (ref 0.35–4.50)

## 2018-06-12 DIAGNOSIS — M1711 Unilateral primary osteoarthritis, right knee: Secondary | ICD-10-CM | POA: Diagnosis not present

## 2018-07-16 DIAGNOSIS — H43822 Vitreomacular adhesion, left eye: Secondary | ICD-10-CM | POA: Diagnosis not present

## 2018-07-16 LAB — HM DIABETES EYE EXAM

## 2018-07-28 DIAGNOSIS — R69 Illness, unspecified: Secondary | ICD-10-CM | POA: Diagnosis not present

## 2018-08-01 DIAGNOSIS — R69 Illness, unspecified: Secondary | ICD-10-CM | POA: Diagnosis not present

## 2018-09-07 ENCOUNTER — Encounter: Payer: Self-pay | Admitting: Family Medicine

## 2018-09-07 NOTE — Telephone Encounter (Signed)
Sent to PCP to advise if OK to work on fixing up handicap form for patient.   Thanks

## 2018-09-10 NOTE — Telephone Encounter (Signed)
Sent to PCP to advise 

## 2018-09-10 NOTE — Telephone Encounter (Signed)
Pt dropped off a handicapp placard form to be completed by Dr. Caryl Bis. Form is up front in color folder.

## 2018-09-11 NOTE — Telephone Encounter (Signed)
Placed in Red folder to complete if PCP is agreeable

## 2018-09-12 NOTE — Telephone Encounter (Signed)
Sent to PCP as an FYI  

## 2018-10-08 ENCOUNTER — Ambulatory Visit: Payer: Medicare HMO | Admitting: Family Medicine

## 2018-10-23 ENCOUNTER — Other Ambulatory Visit: Payer: Self-pay | Admitting: Family Medicine

## 2018-10-23 DIAGNOSIS — R69 Illness, unspecified: Secondary | ICD-10-CM | POA: Diagnosis not present

## 2018-11-13 ENCOUNTER — Other Ambulatory Visit: Payer: Self-pay | Admitting: Family Medicine

## 2018-11-13 DIAGNOSIS — E039 Hypothyroidism, unspecified: Secondary | ICD-10-CM

## 2018-11-13 MED ORDER — LEVOTHYROXINE SODIUM 25 MCG PO TABS: 62.5000 ug | ORAL_TABLET | Freq: Every day | ORAL | 0 refills | Status: DC

## 2018-11-13 NOTE — Telephone Encounter (Signed)
Copied from [place]. Topic: Quick Communication - Rx Refill/Question >> Nov 13, 2018 12:06 PM Virl Axe D wrote: Medication: levothyroxine (SYNTHROID, LEVOTHROID) 25 MCG tablet / Pt would like to know if she needs to have a sooner appointment than the one scheduled in July. Please advise. Pt has enough for 10 more days.  Has the patient contacted their pharmacy? Yes.   (Agent: If no, request that the patient contact the pharmacy for the refill.) (Agent: If yes, when and what did the pharmacy advise?) Pt stated pharmacy told her they could not get in touch with office  Preferred Pharmacy (with phone number or street name): [id number] - [place], [place] [address] [phone] (Phone) [phone] (Fax)    Agent: Please be advised that RX refills may take up to 3 business days. We ask that you follow-up with your pharmacy.

## 2019-01-15 ENCOUNTER — Other Ambulatory Visit: Payer: Self-pay

## 2019-01-15 ENCOUNTER — Ambulatory Visit (INDEPENDENT_AMBULATORY_CARE_PROVIDER_SITE_OTHER): Payer: Medicare HMO | Admitting: Family Medicine

## 2019-01-15 ENCOUNTER — Encounter: Payer: Self-pay | Admitting: Family Medicine

## 2019-01-15 VITALS — BP 124/58 | HR 73

## 2019-01-15 DIAGNOSIS — E119 Type 2 diabetes mellitus without complications: Secondary | ICD-10-CM

## 2019-01-15 DIAGNOSIS — M170 Bilateral primary osteoarthritis of knee: Secondary | ICD-10-CM | POA: Diagnosis not present

## 2019-01-15 DIAGNOSIS — E7800 Pure hypercholesterolemia, unspecified: Secondary | ICD-10-CM | POA: Diagnosis not present

## 2019-01-15 DIAGNOSIS — Z853 Personal history of malignant neoplasm of breast: Secondary | ICD-10-CM | POA: Diagnosis not present

## 2019-01-15 DIAGNOSIS — E890 Postprocedural hypothyroidism: Secondary | ICD-10-CM | POA: Diagnosis not present

## 2019-01-15 MED ORDER — TETANUS-DIPHTH-ACELL PERTUSSIS 5-2.5-18.5 LF-MCG/0.5 IM SUSP: 0.5000 mL | Freq: Once | INTRAMUSCULAR | 0 refills | Status: AC

## 2019-01-15 NOTE — Assessment & Plan Note (Signed)
Encouraged her to follow-up with orthopedics again if her knees start to bother her more.

## 2019-01-15 NOTE — Assessment & Plan Note (Signed)
Patient notes history of breast cancer 40 years ago.  She status post bilateral mastectomy.  She notes no new palpable lesions.  No need for additional imaging given history of bilateral mastectomy.

## 2019-01-15 NOTE — Assessment & Plan Note (Signed)
Continue Synthroid.  Check TSH.  The patient would like to go back on biotin for her hair as it was beneficial.  We will see what her TSH is and then confer with our clinical pharmacist regarding taking this in combination with Synthroid.

## 2019-01-15 NOTE — Progress Notes (Signed)
Virtual Visit via telephone Note  This visit type was conducted due to national recommendations for restrictions regarding the COVID-19 pandemic (e.g. social distancing).  This format is felt to be most appropriate for this patient at this time.  All issues noted in this document were discussed and addressed.  No physical exam was performed (except for noted visual exam findings with Video Visits).   I connected with Erin Cook today at 10:00 AM EDT by telephone and verified that I am speaking with the correct person using two identifiers. Location patient: home Location provider: work Persons participating in the virtual visit: patient, provider  I discussed the limitations, risks, security and privacy concerns of performing an evaluation and management service by telephone and the availability of in person appointments. I also discussed with the patient that there may be a patient responsible charge related to this service. The patient expressed understanding and agreed to proceed.  Interactive audio and video telecommunications were attempted between this provider and patient, however failed, due to patient having technical difficulties OR patient did not have access to video capability.  We continued and completed visit with audio only.   Reason for visit: follow-up  HPI: DIABETES Disease Monitoring: Blood Sugar ranges-112-129 Polyuria/phagia/dipsia- no      Optho- UTD Medications: Compliance- metformin Hypoglycemic symptoms- occasionally gets shaky, if skips a meal, CBG in 90s when this happens  HYPOTHYROIDISM Disease Monitoring Weight changes: no  Skin Changes: hair thinning Heat/Cold intolerance: no  Medication Monitoring Compliance:  Taking synthroid   Last TSH:   Lab Results  Component Value Date   TSH 1.94 06/05/2018   HYPERLIPIDEMIA Symptoms Chest pain on exertion:  no    Medications: Compliance- taking crestor Right upper quadrant pain- no  Muscle aches- no   Joint pain: knees and hips. Saw Dr Marry Guan. Advised glucosamine. Has been beneficial. Had an injection that was beneficial though has worn off. Lost exercises that she was given.   ROS: See pertinent positives and negatives per HPI.  Past Medical History:  Diagnosis Date  . Arthritis    hands  . Car sickness   . Diabetes mellitus without complication ([place])   . GERD (gastroesophageal reflux disease)   . HOH (hard of hearing)    mild  . Hyperlipidemia   . Hypothyroidism     Past Surgical History:  Procedure Laterality Date  . BREAST RECONSTRUCTION Bilateral 1978  . CATARACT EXTRACTION W/PHACO Left 12/11/2017   Procedure: CATARACT EXTRACTION PHACO AND INTRAOCULAR LENS PLACEMENT ([facility])  LEFT DIABETIC;  Surgeon: Leandrew Koyanagi, MD;  Location: [facility];  Service: Ophthalmology;  Laterality: Left;  use left arm for BPs and IVs  . CYST EXCISION  1980   In her chest.   . MASTECTOMY Bilateral 1978  . ROTATOR CUFF REPAIR Right July, 2015, Jan, 2016   Dr. Lisette Grinder.  Dr.  Amedeo Plenty.   . THYROIDECTOMY  1980  . TONSILLECTOMY  1945    Family History  Problem Relation Age of Onset  . Stroke Mother   . Stroke Father     SOCIAL HX: nonsmoker   Current Outpatient Medications:  .  Ascorbic Acid (VITAMIN C) 1000 MG tablet, Take 1,000 mg by mouth daily., Disp: , Rfl:  .  glucose blood (ONE TOUCH ULTRA TEST) test strip, USE TO TEST BLOOD SUGAR ONCE DAILY, Disp: 100 each, Rfl: 2 .  levothyroxine (SYNTHROID) 25 MCG tablet, Take 2.5 tablets (62.5 mcg total) by mouth daily with breakfast., Disp: 225 tablet, Rfl:  0 .  levothyroxine (SYNTHROID, LEVOTHROID) 50 MCG tablet, TAKE 1 TABLET (50 MCG TOTAL) BY MOUTH DAILY WITH BREAKFAST., Disp: 90 tablet, Rfl: 0 .  meloxicam (MOBIC) 7.5 MG tablet, TAKE 1 TABLET BY MOUTH EVERY DAY, Disp: 30 tablet, Rfl: 0 .  metFORMIN (GLUCOPHAGE) 500 MG tablet, Take 1 tablet (500 mg total) by mouth 2 (two) times daily with a meal., Disp: 180 tablet, Rfl: 3 .   pyridOXINE (VITAMIN B-6) 100 MG tablet, Take 200 mg by mouth daily., Disp: , Rfl:  .  rosuvastatin (CRESTOR) 20 MG tablet, Take 1 tablet (20 mg total) by mouth daily., Disp: 90 tablet, Rfl: 3 .  TURMERIC PO, Take by mouth., Disp: , Rfl:  .  vitamin B-12 (CYANOCOBALAMIN) 1000 MCG tablet, Take 1,000 mcg by mouth daily., Disp: , Rfl:  .  Tdap (BOOSTRIX) 5-2.5-18.5 LF-MCG/0.5 injection, Inject 0.5 mLs into the muscle once for 1 dose., Disp: 0.5 mL, Rfl: 0  EXAM: This is a telehealth telephone visit and thus no physical exam was completed.  ASSESSMENT AND PLAN:  Discussed the following assessment and plan:  Diabetes mellitus, type 2 Adequately controlled based on CBGs.  Continue metformin.  She will come in for an A1c.  Hypothyroidism, postop Continue Synthroid.  Check TSH.  The patient would like to go back on biotin for her hair as it was beneficial.  We will see what her TSH is and then confer with our clinical pharmacist regarding taking this in combination with Synthroid.  Arthritis, degenerative Encouraged her to follow-up with orthopedics again if her knees start to bother her more.  History of breast cancer Patient notes history of breast cancer 40 years ago.  She status post bilateral mastectomy.  She notes no new palpable lesions.  No need for additional imaging given history of bilateral mastectomy.  Hypercholesteremia Check LDL.  Continue Crestor.  Tetanus vaccine sent to pharmacy for the patient to complete there.  Social distancing precautions and sick precautions given regarding COVID-19.   I discussed the assessment and treatment plan with the patient. The patient was provided an opportunity to ask questions and all were answered. The patient agreed with the plan and demonstrated an understanding of the instructions.   The patient was advised to call back or seek an in-person evaluation if the symptoms worsen or if the condition fails to improve as anticipated.  I  provided 21 minutes of non-face-to-face time during this encounter.   [name], MD

## 2019-01-15 NOTE — Assessment & Plan Note (Signed)
Adequately controlled based on CBGs.  Continue metformin.  She will come in for an A1c.

## 2019-01-15 NOTE — Assessment & Plan Note (Signed)
Check LDL.  Continue Crestor.

## 2019-01-17 ENCOUNTER — Other Ambulatory Visit (INDEPENDENT_AMBULATORY_CARE_PROVIDER_SITE_OTHER): Payer: Medicare HMO

## 2019-01-17 ENCOUNTER — Other Ambulatory Visit: Payer: Self-pay

## 2019-01-17 DIAGNOSIS — E7800 Pure hypercholesterolemia, unspecified: Secondary | ICD-10-CM | POA: Diagnosis not present

## 2019-01-17 DIAGNOSIS — E119 Type 2 diabetes mellitus without complications: Secondary | ICD-10-CM

## 2019-01-17 DIAGNOSIS — E890 Postprocedural hypothyroidism: Secondary | ICD-10-CM | POA: Diagnosis not present

## 2019-01-17 LAB — COMPREHENSIVE METABOLIC PANEL
ALT: 13 U/L (ref 0–35)
AST: 21 U/L (ref 0–37)
Albumin: 4.4 g/dL (ref 3.5–5.2)
Alkaline Phosphatase: 46 U/L (ref 39–117)
BUN: 12 mg/dL (ref 6–23)
CO2: 26 mEq/L (ref 19–32)
Calcium: 9.2 mg/dL (ref 8.4–10.5)
Chloride: 105 mEq/L (ref 96–112)
Creatinine, Ser: 0.61 mg/dL (ref 0.40–1.20)
GFR: 94.22 mL/min (ref >60.00)
Glucose, Bld: 114 mg/dL — ABNORMAL HIGH (ref 70–99)
Potassium: 3.7 mEq/L (ref 3.5–5.1)
Sodium: 141 mEq/L (ref 135–145)
Total Bilirubin: 0.6 mg/dL (ref 0.2–1.2)
Total Protein: 6.5 g/dL (ref 6.0–8.3)

## 2019-01-17 LAB — LDL CHOLESTEROL, DIRECT: Direct LDL: 64 mg/dL

## 2019-01-17 LAB — HEMOGLOBIN A1C: Hgb A1c MFr Bld: 7.3 % — ABNORMAL HIGH (ref 4.6–6.5)

## 2019-01-17 LAB — TSH: TSH: 1.42 u[IU]/mL (ref 0.35–4.50)

## 2019-01-18 DIAGNOSIS — R69 Illness, unspecified: Secondary | ICD-10-CM | POA: Diagnosis not present

## 2019-01-23 ENCOUNTER — Other Ambulatory Visit: Payer: Self-pay | Admitting: Family Medicine

## 2019-01-23 DIAGNOSIS — E039 Hypothyroidism, unspecified: Secondary | ICD-10-CM

## 2019-01-23 DIAGNOSIS — E119 Type 2 diabetes mellitus without complications: Secondary | ICD-10-CM

## 2019-01-23 MED ORDER — LEVOTHYROXINE SODIUM 25 MCG PO TABS: 62.5000 ug | ORAL_TABLET | Freq: Every day | ORAL | 1 refills | Status: DC

## 2019-01-23 MED ORDER — ROSUVASTATIN CALCIUM 20 MG PO TABS: 20.0000 mg | ORAL_TABLET | Freq: Every day | ORAL | 3 refills | Status: DC

## 2019-01-23 MED ORDER — METFORMIN HCL 500 MG PO TABS: ORAL_TABLET | ORAL | 3 refills | Status: DC

## 2019-02-05 DIAGNOSIS — R69 Illness, unspecified: Secondary | ICD-10-CM | POA: Diagnosis not present

## 2019-02-28 DIAGNOSIS — R69 Illness, unspecified: Secondary | ICD-10-CM | POA: Diagnosis not present

## 2019-04-18 DIAGNOSIS — R69 Illness, unspecified: Secondary | ICD-10-CM | POA: Diagnosis not present

## 2019-05-29 ENCOUNTER — Ambulatory Visit: Payer: Medicare HMO

## 2019-07-12 ENCOUNTER — Other Ambulatory Visit: Payer: Self-pay | Admitting: Family Medicine

## 2019-07-12 DIAGNOSIS — R69 Illness, unspecified: Secondary | ICD-10-CM | POA: Diagnosis not present

## 2019-07-15 ENCOUNTER — Other Ambulatory Visit: Payer: Self-pay

## 2019-07-17 ENCOUNTER — Other Ambulatory Visit: Payer: Self-pay | Admitting: Family Medicine

## 2019-07-17 DIAGNOSIS — E039 Hypothyroidism, unspecified: Secondary | ICD-10-CM

## 2019-07-19 ENCOUNTER — Other Ambulatory Visit: Payer: Self-pay

## 2019-07-19 ENCOUNTER — Ambulatory Visit (INDEPENDENT_AMBULATORY_CARE_PROVIDER_SITE_OTHER): Payer: Medicare HMO | Admitting: Family Medicine

## 2019-07-19 ENCOUNTER — Encounter: Payer: Self-pay | Admitting: Family Medicine

## 2019-07-19 DIAGNOSIS — E890 Postprocedural hypothyroidism: Secondary | ICD-10-CM

## 2019-07-19 DIAGNOSIS — E119 Type 2 diabetes mellitus without complications: Secondary | ICD-10-CM | POA: Diagnosis not present

## 2019-07-19 DIAGNOSIS — K219 Gastro-esophageal reflux disease without esophagitis: Secondary | ICD-10-CM

## 2019-07-19 DIAGNOSIS — K2270 Barrett's esophagus without dysplasia: Secondary | ICD-10-CM | POA: Diagnosis not present

## 2019-07-19 NOTE — Assessment & Plan Note (Signed)
Asymptomatic.  No longer on medication.  I will send a message to my CMA to try to request the patient's most recent EGD records from [facility].

## 2019-07-19 NOTE — Assessment & Plan Note (Signed)
Continue Synthroid °

## 2019-07-19 NOTE — Progress Notes (Signed)
Virtual Visit via telephone Note  This visit type was conducted due to national recommendations for restrictions regarding the COVID-19 pandemic (e.g. social distancing).  This format is felt to be most appropriate for this patient at this time.  All issues noted in this document were discussed and addressed.  No physical exam was performed (except for noted visual exam findings with Video Visits).   I connected with Erin Cook today at 10:00 AM EST by a video enabled telemedicine application or telephone and verified that I am speaking with the correct person using two identifiers. Location patient: home Location provider: work  Persons participating in the virtual visit: patient, provider  I discussed the limitations, risks, security and privacy concerns of performing an evaluation and management service by telephone and the availability of in person appointments. I also discussed with the patient that there may be a patient responsible charge related to this service. The patient expressed understanding and agreed to proceed.  Interactive audio and video telecommunications were attempted between this provider and patient, however failed, due to patient having technical difficulties OR patient did not have access to video capability.  We continued and completed visit with audio only.   Reason for visit: follow-up  HPI: DIABETES Disease Monitoring: Blood Sugar ranges-112 this am, sweets do run it up higher, though tries to avoid those things Polyuria/phagia/dipsia- no      Optho- due, has an appointment next month Medications: Compliance- taking metformin Hypoglycemic symptoms- felt like it dropped one time recently with not eating much breakfast, got real nauseated and nervous, hard candy helped  HYPOTHYROIDISM Disease Monitoring Weight changes: some weight loss over the past couple of years, had changed her diet with significant decrease in sweets Skin Changes: no Heat/Cold  intolerance: no  Medication Monitoring Compliance:  Taking synthroid   Last TSH:   Lab Results  Component Value Date   TSH 1.42 01/17/2019   GERD:   Reflux symptoms: no   Abd pain: no   Blood in stool: no  Dysphagia: no   EGD: history in the past, history of Barret's esophagus - does not think she needed follow-up Medication: not taking omperazole    ROS: See pertinent positives and negatives per HPI.  Past Medical History:  Diagnosis Date  . Arthritis    hands  . Car sickness   . Diabetes mellitus without complication ([place])   . GERD (gastroesophageal reflux disease)   . HOH (hard of hearing)    mild  . Hyperlipidemia   . Hypothyroidism     Past Surgical History:  Procedure Laterality Date  . BREAST RECONSTRUCTION Bilateral 1978  . CATARACT EXTRACTION W/PHACO Left 12/11/2017   Procedure: CATARACT EXTRACTION PHACO AND INTRAOCULAR LENS PLACEMENT ([facility])  LEFT DIABETIC;  Surgeon: Leandrew Koyanagi, MD;  Location: [facility];  Service: Ophthalmology;  Laterality: Left;  use left arm for BPs and IVs  . CYST EXCISION  1980   In her chest.   . MASTECTOMY Bilateral 1978  . ROTATOR CUFF REPAIR Right July, 2015, Jan, 2016   Dr. Lisette Grinder.  Dr.  Amedeo Plenty.   . THYROIDECTOMY  1980  . TONSILLECTOMY  1945    Family History  Problem Relation Age of Onset  . Stroke Mother   . Stroke Father     SOCIAL HX: nonsmoker   Current Outpatient Medications:  .  Ascorbic Acid (VITAMIN C) 1000 MG tablet, Take 1,000 mg by mouth daily., Disp: , Rfl:  .  levothyroxine (SYNTHROID) 25 MCG  tablet, TAKE 2.5 TABLETS (62.5 MCG TOTAL) BY MOUTH DAILY WITH BREAKFAST., Disp: 225 tablet, Rfl: 1 .  metFORMIN (GLUCOPHAGE) 500 MG tablet, Take 1000 mg (2 tablets) by mouth daily with breakfast and take 500 mg (1 tablet) by mouth daily with dinner, Disp: 270 tablet, Rfl: 3 .  ONETOUCH ULTRA test strip, USE TO TEST BLOOD SUGAR ONCE DAILY, Disp: 100 strip, Rfl: 2 .  rosuvastatin (CRESTOR) 20 MG  tablet, Take 1 tablet (20 mg total) by mouth daily., Disp: 90 tablet, Rfl: 3 .  TURMERIC PO, Take by mouth., Disp: , Rfl:  .  vitamin B-12 (CYANOCOBALAMIN) 1000 MCG tablet, Take 1,000 mcg by mouth daily., Disp: , Rfl:  .  oxyCODONE (OXY IR/ROXICODONE) 5 MG immediate release tablet, oxycodone 5 mg tablet, Disp: , Rfl:  .  pyridOXINE (VITAMIN B-6) 100 MG tablet, Take 200 mg by mouth daily., Disp: , Rfl:   EXAM: This was a telehealth telephone visit and thus no physical exam was done.  ASSESSMENT AND PLAN:  Discussed the following assessment and plan:  Acid reflux Asymptomatic.  No longer on medication.  I will send a message to my CMA to try to request the patient's most recent EGD records from [facility].  Barrett esophagus History of this in the past though her understanding was that she did not need any further EGDs after her most recent one about 5 years ago.  We will try to request EGD records.  Diabetes mellitus, type 2 Well-controlled based on CBGs.  We have opted to defer lab work at this time given the COVID-19 pandemic.  We will have her return in about 4 months and complete lab work at that time.  Continue Metformin.  Hypothyroidism, postop Continue Synthroid.   Discussed COVID-19 vaccine.  Patient has received her first vaccine and is scheduled for her second vaccine.  Advised that this vaccine has been proven to decrease risk of severe symptoms and decrease risk of symptoms related to Covid though we do not know if it prevents asymptomatic infection.  Advised to continue with good social distancing precautions and mask wearing.  Advised that if she gets any Covid symptoms she should contact us for a visit.  No orders of the defined types were placed in this encounter.   No orders of the defined types were placed in this encounter.    I discussed the assessment and treatment plan with the patient. The patient was provided an opportunity to ask questions and all were  answered. The patient agreed with the plan and demonstrated an understanding of the instructions.   The patient was advised to call back or seek an in-person evaluation if the symptoms worsen or if the condition fails to improve as anticipated.  I provided 18 minutes of non-face-to-face time during this encounter.   [name], MD

## 2019-07-19 NOTE — Assessment & Plan Note (Signed)
Well-controlled based on CBGs.  We have opted to defer lab work at this time given the COVID-19 pandemic.  We will have her return in about 4 months and complete lab work at that time.  Continue Metformin.

## 2019-07-19 NOTE — Assessment & Plan Note (Signed)
History of this in the past though her understanding was that she did not need any further EGDs after her most recent one about 5 years ago.  We will try to request EGD records.

## 2019-07-20 ENCOUNTER — Ambulatory Visit: Payer: Medicare HMO

## 2019-08-09 DIAGNOSIS — R69 Illness, unspecified: Secondary | ICD-10-CM | POA: Diagnosis not present

## 2019-08-13 DIAGNOSIS — H35372 Puckering of macula, left eye: Secondary | ICD-10-CM | POA: Diagnosis not present

## 2019-08-13 LAB — HM DIABETES EYE EXAM

## 2019-08-31 DIAGNOSIS — Z20828 Contact with and (suspected) exposure to other viral communicable diseases: Secondary | ICD-10-CM | POA: Diagnosis not present

## 2019-08-31 DIAGNOSIS — Z03818 Encounter for observation for suspected exposure to other biological agents ruled out: Secondary | ICD-10-CM | POA: Diagnosis not present

## 2019-10-08 DIAGNOSIS — R69 Illness, unspecified: Secondary | ICD-10-CM | POA: Diagnosis not present

## 2019-10-09 ENCOUNTER — Other Ambulatory Visit: Payer: Self-pay

## 2019-10-09 ENCOUNTER — Ambulatory Visit (INDEPENDENT_AMBULATORY_CARE_PROVIDER_SITE_OTHER): Payer: Medicare HMO

## 2019-10-09 VITALS — BP 124/69 | HR 97 | Ht 63.0 in | Wt 120.0 lb

## 2019-10-09 DIAGNOSIS — Z0000 Encounter for general adult medical examination without abnormal findings: Secondary | ICD-10-CM

## 2019-10-09 NOTE — Patient Instructions (Addendum)
  Ms. Tornes , Thank you for taking time to come for your Medicare Wellness Visit. I appreciate your ongoing commitment to your health goals. Please review the following plan we discussed and let me know if I can assist you in the future.   These are the goals we discussed: Goals    . Healthy Lifestyle     Healthy diet Stay active and continue walking for exercise Stay hydrated and drink plenty of fluids       This is a list of the screening recommended for you and due dates:  Health Maintenance  Topic Date Due  . Tetanus Vaccine  03/17/2016  . Complete foot exam   04/04/2018  . Urine Protein Check  04/05/2019  . Hemoglobin A1C  07/20/2019  . DEXA scan (bone density measurement)  12/10/2019*  . Flu Shot  02/02/2020  . Eye exam for diabetics  08/12/2020  . Pneumonia vaccines  Completed  *Topic was postponed. The date shown is not the original due date.

## 2019-10-09 NOTE — Progress Notes (Addendum)
Subjective:   Erin Cook is a 84 y.o. female who presents for Medicare Annual (Subsequent) preventive examination.  Review of Systems:  No ROS.  Medicare Wellness Virtual Visit.  Visual/audio telehealth visit. Vitals provided by patient.  See social history for additional risk factors.   Cardiac Risk Factors include: advanced age (>85men, >1 women);diabetes mellitus     Objective:     Vitals: BP 124/69 (BP Location: Left Arm, Patient Position: Sitting, Cuff Size: Normal)   Pulse 97   Ht 5\' 3"  (1.6 m)   Wt 120 lb (54.4 kg)   BMI 21.26 kg/m   Body mass index is 21.26 kg/m.  Advanced Directives 10/09/2019 12/11/2017 11/21/2016 11/26/2015  Does Patient Have a Medical Advance Directive? Yes Yes Yes Yes  Type of Paramedic of [place];Living will [place];Living will [place];Living will -  Does patient want to make changes to medical advance directive? No - Patient declined No - Patient declined No - Patient declined -  Copy of [place] in Chart? No - copy requested No - copy requested No - copy requested No - copy requested    Tobacco Social History   Tobacco Use  Smoking Status Never Smoker  Smokeless Tobacco Never Used     Counseling given: Not Answered   Clinical Intake:  Pre-visit preparation completed: Yes        Diabetes: Yes(Followed by pcp)  How often do you need to have someone help you when you read instructions, pamphlets, or other written materials from your doctor or pharmacy?: 1 - Never  Interpreter Needed?: No     Past Medical History:  Diagnosis Date  . Arthritis    hands  . Car sickness   . Diabetes mellitus without complication ([place])   . GERD (gastroesophageal reflux disease)   . HOH (hard of hearing)    mild  . Hyperlipidemia   . Hypothyroidism    Past Surgical History:  Procedure Laterality Date  . BREAST RECONSTRUCTION Bilateral 1978  .  CATARACT EXTRACTION W/PHACO Left 12/11/2017   Procedure: CATARACT EXTRACTION PHACO AND INTRAOCULAR LENS PLACEMENT ([place])  LEFT DIABETIC;  Surgeon: Leandrew Koyanagi, MD;  [facility];  Service: Ophthalmology;  Laterality: Left;  use left arm for BPs and IVs  . CYST EXCISION  1980   In her chest.   . MASTECTOMY Bilateral 1978  . ROTATOR CUFF REPAIR Right July, 2015, Jan, 2016   Dr. Lisette Grinder.  Dr.  Amedeo Plenty.   . THYROIDECTOMY  1980  . TONSILLECTOMY  1945   Family History  Problem Relation Age of Onset  . Stroke Mother   . Stroke Father    Social History   Socioeconomic History  . Marital status: Married    Spouse name: Not on file  . Number of children: 1  . Years of education: H/S  . Highest education level: Not on file  Occupational History  . Occupation: Retired  . 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 Smoking status: Never Smoker  . Smokeless tobacco: Never Used  Substance and Sexual Activity  . Alcohol use: No  . Drug use: No  . Sexual activity: Yes  Other Topics Concern  . Not on file  Social History Narrative   Lives with husband. Has one daughter, who lives in [place].      Work - Worked for [employer]. Retired in 1996.  Hobbies - sewing      Diet - regular       Exercise - Walks at [facility]   Social Determinants of Health   Financial Resource Strain: Low Risk   . Difficulty of Paying Living Expenses: Not hard at all  Food Insecurity: No Food Insecurity  . Worried About Charity fundraiser in the Last Year: Never true  . Ran Out of Food in the Last Year: Never true  Transportation Needs: No Transportation Needs  . Lack of Transportation (Medical): No  . Lack of Transportation (Non-Medical): No  Physical Activity:   . Days of Exercise per Week:   . Minutes of Exercise per Session:   Stress: No Stress Concern Present  . Feeling of Stress : Not at all  Social Connections: Unknown  . Frequency of  Communication with Friends and Family: More than three times a week  . Frequency of Social Gatherings with Friends and Family: Not on file  . Attends Religious Services: Not on file  . Active Member of Clubs or Organizations: Not on file  . Attends Archivist Meetings: Not on file  . Marital Status: Married    Outpatient Encounter Medications as of 10/09/2019  Medication Sig  . Ascorbic Acid (VITAMIN C) 1000 MG tablet Take 1,000 mg by mouth daily.  Marland Kitchen levothyroxine (SYNTHROID) 25 MCG tablet TAKE 2.5 TABLETS (62.5 MCG TOTAL) BY MOUTH DAILY WITH BREAKFAST.  . metFORMIN (GLUCOPHAGE) 500 MG tablet Take 1000 mg (2 tablets) by mouth daily with breakfast and take 500 mg (1 tablet) by mouth daily with dinner  . ONETOUCH ULTRA test strip USE TO TEST BLOOD SUGAR ONCE DAILY  . pyridOXINE (VITAMIN B-6) 100 MG tablet Take 200 mg by mouth daily.  . rosuvastatin (CRESTOR) 20 MG tablet Take 1 tablet (20 mg total) by mouth daily.  . TURMERIC PO Take by mouth.  . vitamin B-12 (CYANOCOBALAMIN) 1000 MCG tablet Take 1,000 mcg by mouth daily.  . [DISCONTINUED] oxyCODONE (OXY IR/ROXICODONE) 5 MG immediate release tablet oxycodone 5 mg tablet   No facility-administered encounter medications on file as of 10/09/2019.    Activities of Daily Living In your present state of health, do you have any difficulty performing the following activities: 10/09/2019  Hearing? N  Vision? N  Difficulty concentrating or making decisions? N  Walking or climbing stairs? N  Dressing or bathing? N  Doing errands, shopping? N  Preparing Food and eating ? N  Using the Toilet? N  In the past six months, have you accidently leaked urine? N  Do you have problems with loss of bowel control? N  Managing your Medications? N  Managing your Finances? N  Housekeeping or managing your Housekeeping? N  Some recent data might be hidden    Patient Care Team: Leone Haven, MD as PCP - General (Family Medicine)    Assessment:    This is a routine wellness examination for [facility].  Nurse connected with patient 10/09/19 at 12:30 PM EDT by a telephone enabled telemedicine application and verified that I am speaking with the correct person using two identifiers. Patient stated full name and DOB. Patient gave permission to continue with virtual visit. Patient's location was at home and Nurse's location was at [place] office.   Patient is alert and oriented x3. Patient denies difficulty focusing or concentrating. Patient likes to play games on the computer and enjoys sewing for brain health.   Health Maintenance Due: -Tdap vaccine- discussed; to  be completed with doctor in visit or local pharmacy.   -Dexa Scan- deferred per patient preference. -Foot Exam- denies wounds, numbness, tingling. Followed by pcp.  -Hgb A1c- 01/17/19 (7.3) Urine microalbumin- 04/04/18 See completed HM at the end of note.   Eye: Visual acuity not assessed. Virtual visit. Followed by their ophthalmologist.  Retinopathy- none reported.  Dental: Visits every 6 months.    Hearing: Demonstrates normal hearing during visit.  Safety:  Patient feels safe at home- yes Patient does have smoke detectors at home- yes Patient does wear sunscreen or protective clothing when in direct sunlight - yes Patient does wear seat belt when in a moving vehicle - yes Patient drives- yes Adequate lighting in walkways free from debris- yes Grab bars and handrails used as appropriate- yes Ambulates with an assistive device- no Cell phone on person when ambulating outside of the home- yes  Social: Alcohol intake - no  Smoking history- never  Smokers in home? none Illicit drug use? none  Medication: Taking as directed and without issues.  Pill box in use -yes  Self managed - yes   Covid-19: Precautions and sickness symptoms discussed. Wears mask, social distancing, hand hygiene as appropriate.   Activities of Daily Living Patient denies needing  assistance with: household chores, feeding themselves, getting from bed to chair, getting to the toilet, bathing/showering, dressing, managing money, or preparing meals.   Discussed the importance of a healthy diet, water intake and the benefits of aerobic exercise.  Physical activity- walking paved driveway back and forth up to 1 mile, active around the house.  Diet:  Regular  Water: good intake Caffeine: 2 cups of coffee  Blood Sugar today post breakfast:123  Other Providers Patient Care Team: Leone Haven, MD as PCP - General (Family Medicine)  Exercise Activities and Dietary recommendations Current Exercise Habits: Home exercise routine, Type of exercise: walking, Intensity: Mild  Goals    . Healthy Lifestyle     Healthy diet Stay active and continue walking for exercise Stay hydrated and drink plenty of fluids       Fall Risk Fall Risk  10/09/2019 07/19/2019 04/04/2018 11/21/2016 11/26/2015  Falls in the past year? 0 0 No No No  Number falls in past yr: - 0 - - -  Injury with Fall? - - - - -  Comment - - - - -  Risk Factor Category  - - - - -  Follow up Falls evaluation completed Falls evaluation completed - - -   Timed Get Up and Go performed: no, virtual visit  Depression Screen PHQ 2/9 Scores 10/09/2019 07/19/2019 04/04/2018 11/21/2016  PHQ - 2 Score 0 0 0 0  PHQ- 9 Score - - - 0  Exception Documentation - - - -     Cognitive Function     6CIT Screen 10/09/2019 11/21/2016  What Year? 0 points 0 points  What month? 0 points 0 points  What time? 0 points 0 points  Count back from 20 0 points 0 points  Months in reverse 0 points 0 points  Repeat phrase 0 points 0 points  Total Score 0 0    Immunization History  Administered Date(s) Administered  . Influenza Split 06/05/2003, 04/05/2012, 03/23/2013  . Influenza, High Dose Seasonal PF 03/27/2014, 04/04/2017, 03/20/2018, 02/28/2019  . Influenza-Unspecified 03/10/2015  . PFIZER SARS-COV-2 Vaccination  07/15/2019, 08/05/2019  . Pneumococcal Conjugate-13 08/04/2015  . Pneumococcal Polysaccharide-23 09/16/2005  . Tdap 03/17/2006  . Zoster 08/22/2012   Screening Tests Health  Maintenance  Topic Date Due  . TETANUS/TDAP  03/17/2016  . FOOT EXAM  04/04/2018  . URINE MICROALBUMIN  04/05/2019  . HEMOGLOBIN A1C  07/20/2019  . DEXA SCAN  12/10/2019 (Originally 05/24/2003)  . INFLUENZA VACCINE  02/02/2020  . OPHTHALMOLOGY EXAM  08/12/2020  . PNA vac Low Risk Adult  Completed      Plan:   Keep all routine maintenance appointments.   Follow up 12/10/19 @ 11:00.  Due: Urine microalbumin, A1c, foot exam, Dexa Scan, Tdap; patient request to address the need with PMD at upcoming appointment in June.   Medicare Attestation I have personally reviewed: The patient's medical and social history Their use of alcohol, tobacco or illicit drugs Their current medications and supplements The patient's functional ability including ADLs,fall risks, home safety risks, cognitive, and hearing and visual impairment Diet and physical activities Evidence for depression   I have reviewed and discussed with patient certain preventive protocols, quality metrics, and best practice recommendations.   Varney Biles, LPN  [id number]

## 2019-10-14 NOTE — Progress Notes (Signed)
I have reviewed the above note and agree.  Brysyn Brandenberger, M.D.  

## 2019-12-10 ENCOUNTER — Encounter: Payer: Self-pay | Admitting: Family Medicine

## 2019-12-10 ENCOUNTER — Ambulatory Visit (INDEPENDENT_AMBULATORY_CARE_PROVIDER_SITE_OTHER): Payer: Medicare HMO | Admitting: Family Medicine

## 2019-12-10 ENCOUNTER — Other Ambulatory Visit: Payer: Self-pay

## 2019-12-10 DIAGNOSIS — M170 Bilateral primary osteoarthritis of knee: Secondary | ICD-10-CM | POA: Diagnosis not present

## 2019-12-10 DIAGNOSIS — E890 Postprocedural hypothyroidism: Secondary | ICD-10-CM

## 2019-12-10 DIAGNOSIS — M7060 Trochanteric bursitis, unspecified hip: Secondary | ICD-10-CM | POA: Insufficient documentation

## 2019-12-10 DIAGNOSIS — I8393 Asymptomatic varicose veins of bilateral lower extremities: Secondary | ICD-10-CM

## 2019-12-10 DIAGNOSIS — M205X2 Other deformities of toe(s) (acquired), left foot: Secondary | ICD-10-CM | POA: Diagnosis not present

## 2019-12-10 DIAGNOSIS — M205X9 Other deformities of toe(s) (acquired), unspecified foot: Secondary | ICD-10-CM | POA: Insufficient documentation

## 2019-12-10 DIAGNOSIS — M7061 Trochanteric bursitis, right hip: Secondary | ICD-10-CM | POA: Diagnosis not present

## 2019-12-10 DIAGNOSIS — E119 Type 2 diabetes mellitus without complications: Secondary | ICD-10-CM

## 2019-12-10 LAB — COMPREHENSIVE METABOLIC PANEL
ALT: 9 U/L (ref 0–35)
AST: 17 U/L (ref 0–37)
Albumin: 4.4 g/dL (ref 3.5–5.2)
Alkaline Phosphatase: 49 U/L (ref 39–117)
BUN: 11 mg/dL (ref 6–23)
CO2: 29 mEq/L (ref 19–32)
Calcium: 9.6 mg/dL (ref 8.4–10.5)
Chloride: 104 mEq/L (ref 96–112)
Creatinine, Ser: 0.59 mg/dL (ref 0.40–1.20)
GFR: 97.69 mL/min (ref >60.00)
Glucose, Bld: 108 mg/dL — ABNORMAL HIGH (ref 70–99)
Potassium: 3.7 mEq/L (ref 3.5–5.1)
Sodium: 141 mEq/L (ref 135–145)
Total Bilirubin: 0.6 mg/dL (ref 0.2–1.2)
Total Protein: 6.2 g/dL (ref 6.0–8.3)

## 2019-12-10 LAB — MICROALBUMIN / CREATININE URINE RATIO
Creatinine,U: 34 mg/dL
Microalb Creat Ratio: 4.3 mg/g (ref 0.0–30.0)
Microalb, Ur: 1.5 mg/dL (ref 0.0–1.9)

## 2019-12-10 LAB — LIPID PANEL
Cholesterol: 129 mg/dL (ref 0–200)
HDL: 57.4 mg/dL (ref >39.00)
LDL Cholesterol: 55 mg/dL (ref 0–99)
NonHDL: 71.28
Total CHOL/HDL Ratio: 2
Triglycerides: 81 mg/dL (ref 0.0–149.0)
VLDL: 16.2 mg/dL (ref 0.0–40.0)

## 2019-12-10 LAB — TSH: TSH: 1.51 u[IU]/mL (ref 0.35–4.50)

## 2019-12-10 LAB — HEMOGLOBIN A1C: Hgb A1c MFr Bld: 7.4 % — ABNORMAL HIGH (ref 4.6–6.5)

## 2019-12-10 NOTE — Assessment & Plan Note (Signed)
Check TSH.  Continue Synthroid. 

## 2019-12-10 NOTE — Patient Instructions (Signed)
Nice to see you. Please call podiatry to get an appointment. We will get you set up with orthopedics. We will get labs today.

## 2019-12-10 NOTE — Assessment & Plan Note (Signed)
Discussed if she has any worsening symptoms we could have her see vascular surgery.  Discussed keeping her legs elevated.  Discussed compression hose.

## 2019-12-10 NOTE — Assessment & Plan Note (Signed)
Refer to orthopedics 

## 2019-12-10 NOTE — Assessment & Plan Note (Signed)
Likely the cause of her knee pain.  Refer to orthopedics.

## 2019-12-10 NOTE — Assessment & Plan Note (Signed)
Check A1c.  Continue Metformin.  She will see podiatry for foot exam.

## 2019-12-10 NOTE — Progress Notes (Signed)
[name], MD Phone: [phone]  Erin Cook is a 84 y.o. female who presents today for f/u.  DIABETES Disease Monitoring: Blood Sugar ranges-129 on most recent check Polyuria/phagia/dipsia- no      Optho- UTD Medications: Compliance- taking metformin Hypoglycemic symptoms- no  HYPOTHYROIDISM Disease Monitoring Weight changes: no   Heat/Cold intolerance: no  Medication Monitoring Compliance:  Taking synthroid   Last TSH:   Lab Results  Component Value Date   TSH 1.42 01/17/2019   Bilateral knee pain: This is related to osteoarthritis.  This has been going on a long time.  She previously had an injection in her right knee.  No recent injuries.  She is taking glucosamine.  Right hip pain: Notes this been going on about a year.  Its not very bad.  Hurts more when she lays on that side.  Blue emu helps some.  Varicose veins: Bilateral lower legs.  Occasional left ankle swelling related to this.  No significant pain.  Left toe issues: Patient notes her second left toe overrides her great toe.  She wears a pad and a spacer to help with this.  She is going to see podiatry for this.   Social History   Tobacco Use  Smoking Status Never Smoker  Smokeless Tobacco Never Used     ROS see history of present illness  Objective  Physical Exam Vitals:   12/10/19 1105  BP: 130/70  Pulse: 74  Temp: (!) 97.1 F (36.2 C)  SpO2: 99%    BP Readings from Last 3 Encounters:  12/10/19 130/70  10/09/19 124/69  01/15/19 (!) 124/58   Wt Readings from Last 3 Encounters:  12/10/19 116 lb 6.4 oz (52.8 kg)  10/09/19 120 lb (54.4 kg)  07/19/19 120 lb (54.4 kg)    Physical Exam Constitutional:      General: She is not in acute distress.    Appearance: She is not diaphoretic.  Cardiovascular:     Rate and Rhythm: Normal rate and regular rhythm.     Heart sounds: Normal heart sounds.  Pulmonary:     Effort: Pulmonary effort is normal.     Breath sounds: Normal  breath sounds.  Musculoskeletal:     Comments: Mild tenderness over the right greater trochanter, good range of motion internal and external range of motion bilateral hips, bilateral knees with medial joint line tenderness, no warmth, swelling, or erythema  Skin:    General: Skin is warm and dry.     Comments: Varicose veins bilateral lower extremities, slight swelling at left ankle  Neurological:     Mental Status: She is alert.      Assessment/Plan: Please see individual problem list.  Hypothyroidism, postop Check TSH.  Continue Synthroid.  Diabetes mellitus, type 2 Check A1c.  Continue Metformin.  She will see podiatry for foot exam.  Arthritis, degenerative Likely the cause of her knee pain.  Refer to orthopedics.  Trochanteric bursitis Refer to orthopedics.  Varicose veins of both lower extremities Discussed if she has any worsening symptoms we could have her see vascular surgery.  Discussed keeping her legs elevated.  Discussed compression hose.  Acquired overriding toe She will schedule an appointment with podiatry.   Orders Placed This Encounter  Procedures  . TSH  . Comp Met (CMET)  . HgB A1c  . Lipid panel  . Urine Microalbumin w/creat. ratio  . Ambulatory referral to Orthopedic Surgery    Referral Priority:   Routine    Referral Type:  Surgical    Referral Reason:   Specialty Services Required    Requested Specialty:   Orthopedic Surgery    Number of Visits Requested:   1    No orders of the defined types were placed in this encounter.   This visit occurred during the SARS-CoV-2 public health emergency.  Safety protocols were in place, including screening questions prior to the visit, additional usage of staff PPE, and extensive cleaning of exam room while observing appropriate contact time as indicated for disinfecting solutions.    [name], MD [place]

## 2019-12-10 NOTE — Assessment & Plan Note (Signed)
She will schedule an appointment with podiatry.

## 2019-12-12 ENCOUNTER — Other Ambulatory Visit: Payer: Self-pay | Admitting: Family Medicine

## 2019-12-12 DIAGNOSIS — E119 Type 2 diabetes mellitus without complications: Secondary | ICD-10-CM

## 2019-12-12 MED ORDER — METFORMIN HCL 500 MG PO TABS: 1000.0000 mg | ORAL_TABLET | Freq: Two times a day (BID) | ORAL | 1 refills | Status: DC

## 2019-12-24 DIAGNOSIS — G8929 Other chronic pain: Secondary | ICD-10-CM | POA: Diagnosis not present

## 2019-12-24 DIAGNOSIS — M25461 Effusion, right knee: Secondary | ICD-10-CM | POA: Diagnosis not present

## 2019-12-24 DIAGNOSIS — M25561 Pain in right knee: Secondary | ICD-10-CM | POA: Diagnosis not present

## 2019-12-24 DIAGNOSIS — M25462 Effusion, left knee: Secondary | ICD-10-CM | POA: Diagnosis not present

## 2019-12-24 DIAGNOSIS — M25562 Pain in left knee: Secondary | ICD-10-CM | POA: Diagnosis not present

## 2019-12-24 DIAGNOSIS — M170 Bilateral primary osteoarthritis of knee: Secondary | ICD-10-CM | POA: Diagnosis not present

## 2020-01-10 ENCOUNTER — Other Ambulatory Visit: Payer: Self-pay | Admitting: Family Medicine

## 2020-01-10 DIAGNOSIS — R69 Illness, unspecified: Secondary | ICD-10-CM | POA: Diagnosis not present

## 2020-01-10 DIAGNOSIS — E039 Hypothyroidism, unspecified: Secondary | ICD-10-CM

## 2020-01-20 ENCOUNTER — Other Ambulatory Visit: Payer: Self-pay | Admitting: Family Medicine

## 2020-03-09 DIAGNOSIS — R69 Illness, unspecified: Secondary | ICD-10-CM | POA: Diagnosis not present

## 2020-03-11 DIAGNOSIS — R69 Illness, unspecified: Secondary | ICD-10-CM | POA: Diagnosis not present

## 2020-03-30 DIAGNOSIS — R69 Illness, unspecified: Secondary | ICD-10-CM | POA: Diagnosis not present

## 2020-04-01 ENCOUNTER — Other Ambulatory Visit: Payer: Self-pay | Admitting: Podiatry

## 2020-04-01 ENCOUNTER — Ambulatory Visit (INDEPENDENT_AMBULATORY_CARE_PROVIDER_SITE_OTHER): Payer: Medicare HMO

## 2020-04-01 ENCOUNTER — Other Ambulatory Visit: Payer: Self-pay

## 2020-04-01 ENCOUNTER — Ambulatory Visit: Payer: Medicare HMO | Admitting: Podiatry

## 2020-04-01 ENCOUNTER — Encounter: Payer: Self-pay | Admitting: Podiatry

## 2020-04-01 DIAGNOSIS — M778 Other enthesopathies, not elsewhere classified: Secondary | ICD-10-CM

## 2020-04-01 DIAGNOSIS — M2012 Hallux valgus (acquired), left foot: Secondary | ICD-10-CM | POA: Diagnosis not present

## 2020-04-01 DIAGNOSIS — M2042 Other hammer toe(s) (acquired), left foot: Secondary | ICD-10-CM | POA: Diagnosis not present

## 2020-04-01 DIAGNOSIS — Q828 Other specified congenital malformations of skin: Secondary | ICD-10-CM

## 2020-04-01 DIAGNOSIS — M719 Bursopathy, unspecified: Secondary | ICD-10-CM | POA: Insufficient documentation

## 2020-04-01 DIAGNOSIS — S43409A Unspecified sprain of unspecified shoulder joint, initial encounter: Secondary | ICD-10-CM | POA: Insufficient documentation

## 2020-04-01 NOTE — Progress Notes (Signed)
Subjective:  Patient ID: Erin Cook, female    DOB: 1936-04-14,  MRN: [id number] HPI Chief Complaint  Patient presents with  . Callouses    Patient  presents today for painful corn right 2nd toe and hammertoe left 2nd toe. She says the left 2nd toe crosses over the 1st toe and gets painful at random times   . Hammer Toe    84 y.o. female presents with the above complaint.   ROS: Denies fever chills nausea vomiting muscle aches pains calf pain back pain chest pain shortness of breath.  Past Medical History:  Diagnosis Date  . Arthritis    hands  . Car sickness   . Diabetes mellitus without complication ([place])   . GERD (gastroesophageal reflux disease)   . HOH (hard of hearing)    mild  . Hyperlipidemia   . Hypothyroidism    Past Surgical History:  Procedure Laterality Date  . BREAST RECONSTRUCTION Bilateral 1978  . CATARACT EXTRACTION W/PHACO Left 12/11/2017   Procedure: CATARACT EXTRACTION PHACO AND INTRAOCULAR LENS PLACEMENT ([place])  LEFT DIABETIC;  Surgeon: Leandrew Koyanagi, MD;  Location: [place];  Service: Ophthalmology;  Laterality: Left;  use left arm for BPs and IVs  . CYST EXCISION  1980   In her chest.   . MASTECTOMY Bilateral 1978  . ROTATOR CUFF REPAIR Right July, 2015, Jan, 2016   Dr. Lisette Grinder.  Dr.  Amedeo Plenty.   . THYROIDECTOMY  1980  . TONSILLECTOMY  1945    Current Outpatient Medications:  .  Ascorbic Acid (VITAMIN C) 1000 MG tablet, Take 1,000 mg by mouth daily., Disp: , Rfl:  .  levothyroxine (SYNTHROID) 25 MCG tablet, TAKE 2.5 TABLETS (62.5 MCG TOTAL) BY MOUTH DAILY WITH BREAKFAST., Disp: 225 tablet, Rfl: 1 .  metFORMIN (GLUCOPHAGE) 500 MG tablet, Take 2 tablets (1,000 mg total) by mouth 2 (two) times daily with a meal., Disp: 360 tablet, Rfl: 1 .  ONETOUCH ULTRA test strip, USE TO TEST BLOOD SUGAR ONCE DAILY, Disp: 100 strip, Rfl: 2 .  rosuvastatin (CRESTOR) 20 MG tablet, TAKE 1 TABLET BY MOUTH EVERY DAY, Disp: 90 tablet, Rfl: 3 .   vitamin B-12 (CYANOCOBALAMIN) 1000 MCG tablet, Take 1,000 mcg by mouth daily., Disp: , Rfl:   No Known Allergies Review of Systems Objective:  There were no vitals filed for this visit.  General: Well developed, nourished, in no acute distress, alert and oriented x3   Dermatological: Skin is warm, dry and supple bilateral. Nails x 10 are well maintained; remaining integument appears unremarkable at this time. There are no open sores, no preulcerative lesions, no rash or signs of infection present.  Vascular: Dorsalis Pedis artery and Posterior Tibial artery pedal pulses are 2/4 bilateral with immedate capillary fill time. Pedal hair growth present. No varicosities and no lower extremity edema present bilateral.   Neruologic: Grossly intact via light touch bilateral. Vibratory intact via tuning fork bilateral. Protective threshold with Semmes Wienstein monofilament intact to all pedal sites bilateral. Patellar and Achilles deep tendon reflexes 2+ bilateral. No Babinski or clonus noted bilateral.   Musculoskeletal: No gross boney pedal deformities bilateral. No pain, crepitus, or limitation noted with foot and ankle range of motion bilateral. Muscular strength 5/5 in all groups tested bilateral.  Hallux abductovalgus deformity bilaterally overlapping second toe left less degree on the right side.  Gait: Unassisted, Nonantalgic.    Radiographs:  Radiographs taken demonstrate an osseously mature individual with significant osteopenia severe hallux valgus deformity and the juxtaposition  of the hallux against the second toe.  Assessment & Plan:   Assessment: Hallux valgus bilateral  Plan: At this point we did discuss surgery but she does not want to have any surgery done so I think that the best thing to do would be padding so we provided her with multiple types of pads today.     Erin Cook, [place]

## 2020-04-08 ENCOUNTER — Other Ambulatory Visit: Payer: Self-pay

## 2020-04-10 ENCOUNTER — Encounter: Payer: Self-pay | Admitting: Family Medicine

## 2020-04-10 ENCOUNTER — Ambulatory Visit (INDEPENDENT_AMBULATORY_CARE_PROVIDER_SITE_OTHER): Payer: Medicare HMO | Admitting: Family Medicine

## 2020-04-10 ENCOUNTER — Other Ambulatory Visit: Payer: Self-pay

## 2020-04-10 DIAGNOSIS — S39012A Strain of muscle, fascia and tendon of lower back, initial encounter: Secondary | ICD-10-CM | POA: Diagnosis not present

## 2020-04-10 DIAGNOSIS — E7800 Pure hypercholesterolemia, unspecified: Secondary | ICD-10-CM

## 2020-04-10 DIAGNOSIS — E119 Type 2 diabetes mellitus without complications: Secondary | ICD-10-CM | POA: Diagnosis not present

## 2020-04-10 LAB — POCT GLYCOSYLATED HEMOGLOBIN (HGB A1C): Hemoglobin A1C: 6.4 % — AB (ref 4.0–5.6)

## 2020-04-10 NOTE — Patient Instructions (Signed)
Nice to see you. Please continue with the dietary changes and stay active.  He will continue your Metformin.

## 2020-04-10 NOTE — Progress Notes (Signed)
  [name], MD Phone: [phone]  Erin Cook is a 84 y.o. female who presents today for f/u.  DIABETES Disease Monitoring: Blood Sugar ranges-129 fasting Polyuria/phagia/dipsia- no      Optho- UTD Medications: Compliance- taking metformin Hypoglycemic symptoms- no Patient has cut down on her sweets quite a bit.  She also walks 1 to 2 miles daily.  HYPERLIPIDEMIA Symptoms Chest pain on exertion:  no   Medications: Compliance- taking crestor Right upper quadrant pain- no  Muscle aches- no  Intermittent left mid back pain: This occurs very intermittently.  She notes no injury.  She will go sit down and it will resolve.  She is unable to tell me how frequently occurs though it is not very frequently.     Social History   Tobacco Use  Smoking Status Never Smoker  Smokeless Tobacco Never Used     ROS see history of present illness  Objective  Physical Exam Vitals:   04/10/20 0907  BP: 115/70  Pulse: 76  Temp: 98.4 F (36.9 C)  SpO2: 98%    BP Readings from Last 3 Encounters:  04/10/20 115/70  12/10/19 130/70  10/09/19 124/69   Wt Readings from Last 3 Encounters:  04/10/20 114 lb 6.4 oz (51.9 kg)  12/10/19 116 lb 6.4 oz (52.8 kg)  10/09/19 120 lb (54.4 kg)    Physical Exam Constitutional:      General: She is not in acute distress.    Appearance: She is not diaphoretic.  Cardiovascular:     Rate and Rhythm: Normal rate and regular rhythm.     Heart sounds: Normal heart sounds.  Pulmonary:     Effort: Pulmonary effort is normal.     Breath sounds: Normal breath sounds.  Musculoskeletal:     Comments: No muscular back tenderness, no left posterior rib tenderness  Skin:    General: Skin is warm and dry.  Neurological:     Mental Status: She is alert.      Assessment/Plan: Please see individual problem list.  Problem List Items Addressed This Visit    Back strain    I suspect mild strain of her back because of the intermittent  discomfort.  Discussed if it becomes more frequent or is persistent she needs to let us know.      Diabetes mellitus, type 2 (HCC)    Fasting sugars seem to be adequately controlled.  We will check an A1c today.  She will continue Metformin 1000 mg twice daily.  Continue diet and exercise.      Relevant Orders   POCT HgB A1C (Completed)   Hypercholesteremia    Well-controlled.  Continue Crestor 20 mg daily.         This visit occurred during the SARS-CoV-2 public health emergency.  Safety protocols were in place, including screening questions prior to the visit, additional usage of staff PPE, and extensive cleaning of exam room while observing appropriate contact time as indicated for disinfecting solutions.    [name], MD [place]

## 2020-04-10 NOTE — Assessment & Plan Note (Signed)
I suspect mild strain of her back because of the intermittent discomfort.  Discussed if it becomes more frequent or is persistent she needs to let us know.

## 2020-04-10 NOTE — Assessment & Plan Note (Signed)
Fasting sugars seem to be adequately controlled.  We will check an A1c today.  She will continue Metformin 1000 mg twice daily.  Continue diet and exercise.

## 2020-04-10 NOTE — Assessment & Plan Note (Signed)
Well-controlled.  Continue Crestor 20 mg daily. 

## 2020-04-12 ENCOUNTER — Other Ambulatory Visit: Payer: Self-pay | Admitting: Family Medicine

## 2020-04-13 DIAGNOSIS — R69 Illness, unspecified: Secondary | ICD-10-CM | POA: Diagnosis not present

## 2020-04-30 DIAGNOSIS — L821 Other seborrheic keratosis: Secondary | ICD-10-CM | POA: Diagnosis not present

## 2020-04-30 DIAGNOSIS — L570 Actinic keratosis: Secondary | ICD-10-CM | POA: Diagnosis not present

## 2020-06-14 ENCOUNTER — Other Ambulatory Visit: Payer: Self-pay | Admitting: Family Medicine

## 2020-06-14 DIAGNOSIS — E119 Type 2 diabetes mellitus without complications: Secondary | ICD-10-CM

## 2020-07-04 DEATH — deceased

## 2020-07-12 ENCOUNTER — Other Ambulatory Visit: Payer: Self-pay | Admitting: Family Medicine

## 2020-07-12 DIAGNOSIS — E039 Hypothyroidism, unspecified: Secondary | ICD-10-CM

## 2020-08-13 DIAGNOSIS — E119 Type 2 diabetes mellitus without complications: Secondary | ICD-10-CM | POA: Diagnosis not present

## 2020-08-13 DIAGNOSIS — H35371 Puckering of macula, right eye: Secondary | ICD-10-CM | POA: Diagnosis not present

## 2020-08-13 LAB — HM DIABETES EYE EXAM

## 2020-10-02 DIAGNOSIS — H26492 Other secondary cataract, left eye: Secondary | ICD-10-CM | POA: Diagnosis not present

## 2020-10-07 ENCOUNTER — Other Ambulatory Visit: Payer: Self-pay

## 2020-10-09 ENCOUNTER — Ambulatory Visit: Payer: Medicare HMO

## 2020-10-09 ENCOUNTER — Other Ambulatory Visit: Payer: Self-pay

## 2020-10-09 ENCOUNTER — Ambulatory Visit (INDEPENDENT_AMBULATORY_CARE_PROVIDER_SITE_OTHER): Payer: Medicare HMO | Admitting: Family Medicine

## 2020-10-09 ENCOUNTER — Encounter: Payer: Self-pay | Admitting: Family Medicine

## 2020-10-09 VITALS — BP 118/80 | HR 73 | Temp 98.2°F | Ht 63.0 in | Wt 117.8 lb

## 2020-10-09 DIAGNOSIS — Z780 Asymptomatic menopausal state: Secondary | ICD-10-CM

## 2020-10-09 DIAGNOSIS — E890 Postprocedural hypothyroidism: Secondary | ICD-10-CM

## 2020-10-09 DIAGNOSIS — M170 Bilateral primary osteoarthritis of knee: Secondary | ICD-10-CM | POA: Diagnosis not present

## 2020-10-09 DIAGNOSIS — Z853 Personal history of malignant neoplasm of breast: Secondary | ICD-10-CM

## 2020-10-09 DIAGNOSIS — E7800 Pure hypercholesterolemia, unspecified: Secondary | ICD-10-CM | POA: Diagnosis not present

## 2020-10-09 DIAGNOSIS — E119 Type 2 diabetes mellitus without complications: Secondary | ICD-10-CM | POA: Diagnosis not present

## 2020-10-09 LAB — COMPREHENSIVE METABOLIC PANEL
ALT: 9 U/L (ref 0–35)
AST: 17 U/L (ref 0–37)
Albumin: 4.6 g/dL (ref 3.5–5.2)
Alkaline Phosphatase: 50 U/L (ref 39–117)
BUN: 11 mg/dL (ref 6–23)
CO2: 30 mEq/L (ref 19–32)
Calcium: 9.7 mg/dL (ref 8.4–10.5)
Chloride: 103 mEq/L (ref 96–112)
Creatinine, Ser: 0.62 mg/dL (ref 0.40–1.20)
GFR: 82.96 mL/min (ref >60.00)
Glucose, Bld: 124 mg/dL — ABNORMAL HIGH (ref 70–99)
Potassium: 3.9 mEq/L (ref 3.5–5.1)
Sodium: 141 mEq/L (ref 135–145)
Total Bilirubin: 0.6 mg/dL (ref 0.2–1.2)
Total Protein: 6.5 g/dL (ref 6.0–8.3)

## 2020-10-09 LAB — LIPID PANEL
Cholesterol: 134 mg/dL (ref 0–200)
HDL: 68.4 mg/dL (ref >39.00)
LDL Cholesterol: 50 mg/dL (ref 0–99)
NonHDL: 65.82
Total CHOL/HDL Ratio: 2
Triglycerides: 79 mg/dL (ref 0.0–149.0)
VLDL: 15.8 mg/dL (ref 0.0–40.0)

## 2020-10-09 LAB — HEMOGLOBIN A1C: Hgb A1c MFr Bld: 7 % — ABNORMAL HIGH (ref 4.6–6.5)

## 2020-10-09 LAB — TSH: TSH: 2.08 u[IU]/mL (ref 0.35–4.50)

## 2020-10-09 NOTE — Progress Notes (Signed)
[name], MD Phone: [phone]  Erin Cook is a 85 y.o. female who presents today for f/u.  DIABETES Disease Monitoring: Blood Sugar ranges-129 Polyuria/phagia/dipsia- no      Optho- UTD Medications: Compliance- taking metformin Hypoglycemic symptoms- rare, she eats a cracker and symptoms improve  HYPOTHYROIDISM Disease Monitoring Weight changes: no  Skin Changes: dry Heat/Cold intolerance: no though does report she stays cold all the time  Medication Monitoring Compliance:  Taking synthroid   Last TSH:   Lab Results  Component Value Date   TSH 1.51 12/10/2019   HYPERLIPIDEMIA Symptoms Chest pain on exertion:  no   Leg claudication:   no Medications: Compliance- taking crestor Right upper quadrant pain- no  Muscle aches- no  Osteoarthritis bilateral knees: She follows with orthopedics.  They gave her medication though she has not been taking this consistently.  She just started back on that with some benefit.  History of breast cancer: Patient is status post bilateral mastectomy.  She notes no changes to her chest wall.  Social History   Tobacco Use  Smoking Status Never Smoker  Smokeless Tobacco Never Used    Current Outpatient Medications on File Prior to Visit  Medication Sig Dispense Refill  . Ascorbic Acid (VITAMIN C) 1000 MG tablet Take 1,000 mg by mouth daily.    Marland Kitchen levothyroxine (SYNTHROID) 25 MCG tablet TAKE 2&1/2 TABLETS BY MOUTH DAILY WITH BREAKFAST 225 tablet 1  . metFORMIN (GLUCOPHAGE) 500 MG tablet TAKE 2 TABLETS (1,000 MG TOTAL) BY MOUTH 2 (TWO) TIMES DAILY WITH A MEAL. 360 tablet 1  . ONETOUCH ULTRA test strip USE TO TEST BLOOD SUGAR ONCE DAILY 100 strip 2  . rosuvastatin (CRESTOR) 20 MG tablet TAKE 1 TABLET BY MOUTH EVERY DAY 90 tablet 3  . vitamin B-12 (CYANOCOBALAMIN) 1000 MCG tablet Take 1,000 mcg by mouth daily.     No current facility-administered medications on file prior to visit.     ROS see history of present  illness  Objective  Physical Exam Vitals:   10/09/20 0904  BP: 118/80  Pulse: 73  Temp: 98.2 F (36.8 C)  SpO2: 97%    BP Readings from Last 3 Encounters:  10/09/20 118/80  04/10/20 115/70  12/10/19 130/70   Wt Readings from Last 3 Encounters:  10/09/20 117 lb 12.8 oz (53.4 kg)  04/10/20 114 lb 6.4 oz (51.9 kg)  12/10/19 116 lb 6.4 oz (52.8 kg)    Physical Exam Constitutional:      General: She is not in acute distress.    Appearance: She is not diaphoretic.  Cardiovascular:     Rate and Rhythm: Normal rate and regular rhythm.     Heart sounds: Normal heart sounds.  Pulmonary:     Effort: Pulmonary effort is normal.     Breath sounds: Normal breath sounds.  Skin:    General: Skin is warm and dry.  Neurological:     Mental Status: She is alert.      Assessment/Plan: Please see individual problem list.  Problem List Items Addressed This Visit    Arthritis, degenerative    Bilateral knee osteoarthritis.  She will continue to see orthopedics.      Diabetes mellitus, type 2 (HCC)    Check A1c.  Continue Metformin 1000 mg twice daily.      Relevant Orders   HgB A1c   History of breast cancer    Patient notes no changes.  She declines exam today.  She will let us know  if she notices any chest wall changes.      Hypercholesteremia    Continue Crestor 20 mg once daily.  Check lipid panel.      Relevant Orders   Comp Met (CMET)   Lipid panel   Hypothyroidism, postop    Check TSH.  Continue Synthroid.      Relevant Orders   TSH    Other Visit Diagnoses    Postmenopausal estrogen deficiency    -  Primary   Relevant Orders   DG Bone Density       Health Maintenance: Patient reports she is up-to-date on her Covid vaccines and is going to get her fourth shot later this month.  DEXA scan ordered.  Patient know she needs to call to schedule this.     This visit occurred during the SARS-CoV-2 public health emergency.  Safety protocols were in place,  including screening questions prior to the visit, additional usage of staff PPE, and extensive cleaning of exam room while observing appropriate contact time as indicated for disinfecting solutions.    [name], MD [place]

## 2020-10-09 NOTE — Assessment & Plan Note (Signed)
Continue Crestor 20 mg once daily.  Check lipid panel.

## 2020-10-09 NOTE — Assessment & Plan Note (Signed)
Bilateral knee osteoarthritis.  She will continue to see orthopedics.

## 2020-10-09 NOTE — Assessment & Plan Note (Signed)
Patient notes no changes.  She declines exam today.  She will let us know if she notices any chest wall changes.

## 2020-10-09 NOTE — Patient Instructions (Signed)
Nice to see you. We will get lab work today. I will see you back in 6 months.

## 2020-10-09 NOTE — Assessment & Plan Note (Signed)
Check TSH.  Continue Synthroid. 

## 2020-10-09 NOTE — Assessment & Plan Note (Signed)
Check A1c.  Continue Metformin 1000 mg twice daily. 

## 2020-10-12 ENCOUNTER — Telehealth: Payer: Self-pay | Admitting: Family Medicine

## 2020-10-12 NOTE — Telephone Encounter (Signed)
I placed an order for this last week.  Can we send the order that I placed last week?  Thanks.

## 2020-10-12 NOTE — Telephone Encounter (Signed)
Patient called [facility], [place], for Bone Density test. She was told she needed a referral. Patient is requesting a referral for this location. Fax number is [phone].

## 2020-10-12 NOTE — Telephone Encounter (Signed)
Patient called [facility], [place], for Bone Density test. She was told she needed a referral. Patient is requesting a referral for this location. Fax number is [phone].  [place] Scorsone,cma

## 2020-10-13 ENCOUNTER — Ambulatory Visit: Payer: Self-pay

## 2020-10-13 NOTE — Telephone Encounter (Signed)
Noted.  I was just sending this to you so that you were aware that she wanted to go to [facility].  Thanks.

## 2020-10-19 ENCOUNTER — Telehealth: Payer: Self-pay

## 2020-10-19 ENCOUNTER — Ambulatory Visit (INDEPENDENT_AMBULATORY_CARE_PROVIDER_SITE_OTHER): Payer: Medicare HMO

## 2020-10-19 VITALS — Ht 63.0 in | Wt 117.0 lb

## 2020-10-19 DIAGNOSIS — Z0000 Encounter for general adult medical examination without abnormal findings: Secondary | ICD-10-CM | POA: Diagnosis not present

## 2020-10-19 NOTE — Telephone Encounter (Signed)
Patient stated she and husband went to [facility] and were turned away because there was no order.  Order on 10/09/20 reads preferred location as [place] [facility].

## 2020-10-19 NOTE — Telephone Encounter (Signed)
Erin Cook faxed it off last week to [facility]. I believe they will have to call her to schedule it.

## 2020-10-19 NOTE — Progress Notes (Signed)
Subjective:   Erin Cook is a 85 y.o. female who presents for Medicare Annual (Subsequent) preventive examination.  Review of Systems    No ROS.  Medicare Wellness Virtual Visit.   Cardiac Risk Factors include: advanced age (>99men, >47 women);diabetes mellitus     Objective:    Today's Vitals   10/19/20 0908  Weight: 117 lb (53.1 kg)  Height: 5\' 3"  (1.6 m)   Body mass index is 20.73 kg/m.  Advanced Directives 10/19/2020 10/09/2019 12/11/2017 11/21/2016 11/26/2015  Does Patient Have a Medical Advance Directive? Yes Yes Yes Yes Yes  Type of Paramedic of [place];Living will [place];Living will [place];Living will [place];Living will -  Does patient want to make changes to medical advance directive? No - Patient declined No - Patient declined No - Patient declined No - Patient declined -  Copy of [place] in Chart? No - copy requested No - copy requested No - copy requested No - copy requested No - copy requested    Current Medications (verified) Outpatient Encounter Medications as of 10/19/2020  Medication Sig  . Ascorbic Acid (VITAMIN C) 1000 MG tablet Take 1,000 mg by mouth daily.  Marland Kitchen levothyroxine (SYNTHROID) 25 MCG tablet TAKE 2&1/2 TABLETS BY MOUTH DAILY WITH BREAKFAST  . metFORMIN (GLUCOPHAGE) 500 MG tablet TAKE 2 TABLETS (1,000 MG TOTAL) BY MOUTH 2 (TWO) TIMES DAILY WITH A MEAL.  Marland Kitchen ONETOUCH ULTRA test strip USE TO TEST BLOOD SUGAR ONCE DAILY  . rosuvastatin (CRESTOR) 20 MG tablet TAKE 1 TABLET BY MOUTH EVERY DAY  . vitamin B-12 (CYANOCOBALAMIN) 1000 MCG tablet Take 1,000 mcg by mouth daily.   No facility-administered encounter medications on file as of 10/19/2020.    Allergies (verified) Patient has no known allergies.   History: Past Medical History:  Diagnosis Date  . Arthritis    hands  . Car sickness   . Diabetes mellitus without complication ([place])    . GERD (gastroesophageal reflux disease)   . HOH (hard of hearing)    mild  . Hyperlipidemia   . Hypothyroidism    Past Surgical History:  Procedure Laterality Date  . BREAST RECONSTRUCTION Bilateral 1978  . CATARACT EXTRACTION W/PHACO Left 12/11/2017   Procedure: CATARACT EXTRACTION PHACO AND INTRAOCULAR LENS PLACEMENT ([place])  LEFT DIABETIC;  Surgeon: Leandrew Koyanagi, MD;  Location: [place];  Service: Ophthalmology;  Laterality: Left;  use left arm for BPs and IVs  . CYST EXCISION  1980   In her chest.   . MASTECTOMY Bilateral 1978  . ROTATOR CUFF REPAIR Right July, 2015, Jan, 2016   Dr. Lisette Grinder.  Dr.  Amedeo Plenty.   . THYROIDECTOMY  1980  . TONSILLECTOMY  1945   Family History  Problem Relation Age of Onset  . Stroke Mother   . Stroke Father    Social History   Socioeconomic History  . Marital status: Married    Spouse name: Not on file  . Number of children: 1  . Years of education: H/S  . Highest education level: Not on file  Occupational History  . Occupation: Retired  . 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 Smoking status: Never Smoker  . Smokeless tobacco: Never Used  Substance and Sexual Activity  . Alcohol use: No  . Drug use: No  . Sexual activity: Yes  Other Topics Concern  . Not on file  Social History Narrative  Lives with husband. Has one daughter, who lives in [place].      Work - Worked for [employer]. Retired in 1996.      Hobbies - sewing      Diet - regular       Exercise - Walks at [facility]   Social Determinants of Health   Financial Resource Strain: Low Risk   . Difficulty of Paying Living Expenses: Not hard at all  Food Insecurity: No Food Insecurity  . Worried About Charity fundraiser in the Last Year: Never true  . Ran Out of Food in the Last Year: Never true  Transportation Needs: No Transportation Needs  . Lack of Transportation (Medical): No  . Lack of Transportation (Non-Medical): No   Physical Activity: Sufficiently Active  . Days of Exercise per Week: 7 days  . Minutes of Exercise per Session: 30 min  Stress: No Stress Concern Present  . Feeling of Stress : Not at all  Social Connections: Unknown  . Frequency of Communication with Friends and Family: More than three times a week  . Frequency of Social Gatherings with Friends and Family: Not on file  . Attends Religious Services: Not on file  . Active Member of Clubs or Organizations: Not on file  . Attends Archivist Meetings: Not on file  . Marital Status: Married    Tobacco Counseling Counseling given: Not Answered   Clinical Intake:  Pre-visit preparation completed: Yes    Financial Strains and Diabetes Management: Are you having any financial strains with the device, your supplies or your medication? No .  Does the patient want to be seen by Chronic Care Management for management of their diabetes?  No  Would the patient like to be referred to a Nutritionist or for Diabetic Management?  No    Diabetes: Yes (Followed by pcp)  How often do you need to have someone help you when you read instructions, pamphlets, or other written materials from your doctor or pharmacy?: 1 - Never   Interpreter Needed?: No      Activities of Daily Living In your present state of health, do you have any difficulty performing the following activities: 10/19/2020  Hearing? N  Vision? N  Difficulty concentrating or making decisions? N  Walking or climbing stairs? N  Dressing or bathing? N  Doing errands, shopping? N  Preparing Food and eating ? N  Using the Toilet? N  In the past six months, have you accidently leaked urine? N  Do you have problems with loss of bowel control? N  Managing your Medications? N  Managing your Finances? N  Housekeeping or managing your Housekeeping? N  Some recent data might be hidden    Patient Care Team: Leone Haven, MD as PCP - General (Family  Medicine)  Indicate any recent Medical Services you may have received from other than [facility] providers in the past year (date may be approximate).     Assessment:   This is a routine wellness examination for [facility].  I connected with Erin Cook today by telephone and verified that I am speaking with the correct person using two identifiers. Location patient: home Location provider: work Persons participating in the virtual visit: patient, Marine scientist.    I discussed the limitations, risks, security and privacy concerns of performing an evaluation and management service by telephone and the availability of in person appointments. The patient expressed understanding and verbally consented to this telephonic visit.    Interactive audio  and video telecommunications were attempted between this provider and patient, however failed, due to patient having technical difficulties OR patient did not have access to video capability.  We continued and completed visit with audio only.  Some vital signs may be absent or patient reported.   Hearing/Vision screen  Hearing Screening   125Hz  250Hz  500Hz  1000Hz  2000Hz  3000Hz  4000Hz  6000Hz  8000Hz   Right ear:           Left ear:           Comments: Patient is able to hear conversational tones without difficulty.  No issues reported.  Vision Screening Comments: Wears corrective lenses Cataract extraction, L eye Visual acuity not assessed, virtual visit.  They have seen their ophthalmologist in the last 12 months.     Dietary issues and exercise activities discussed: Current Exercise Habits: Home exercise routine, Type of exercise: walking, Time (Minutes): 30, Frequency (Times/Week): 7, Weekly Exercise (Minutes/Week): 210, Intensity: Mild  Regular diet Good water intake  Goals    . Healthy Lifestyle     Healthy diet Stay active and continue walking for exercise Stay hydrated and drink plenty of fluids      Depression Screen PHQ 2/9 Scores 10/19/2020  10/09/2020 04/10/2020 12/10/2019 10/09/2019 07/19/2019 04/04/2018  PHQ - 2 Score 0 0 0 0 0 0 0  PHQ- 9 Score - - - - - - -  Exception Documentation - - - - - - -    Fall Risk Fall Risk  10/19/2020 10/09/2020 04/10/2020 12/10/2019 10/09/2019  Falls in the past year? 0 0 0 0 0  Number falls in past yr: 0 0 0 0 -  Injury with Fall? - - - - -  Comment - - - - -  Risk Factor Category  - - - - -  Follow up - Falls evaluation completed Falls evaluation completed Falls evaluation completed Falls evaluation completed    [place]: Handrails in use when climbing stairs? Yes Home free of loose throw rugs in walkways, pet beds, electrical cords, etc? Yes  Adequate lighting in your home to reduce risk of falls? Yes   ASSISTIVE DEVICES UTILIZED TO PREVENT FALLS: Use of a cane, walker or w/c? No   TIMED UP AND GO: Was the test performed? No . Virtual visit.   Cognitive Function: Patient is alert and oriented x3.  Denies difficulty focusing, making decisions, memory loss.  Enjoys playing games online for brain stimulating.  MMSE/6CIT deferred. Normal by direct communication and observation.      6CIT Screen 10/09/2019 11/21/2016  What Year? 0 points 0 points  What month? 0 points 0 points  What time? 0 points 0 points  Count back from 20 0 points 0 points  Months in reverse 0 points 0 points  Repeat phrase 0 points 0 points  Total Score 0 0    Immunizations Immunization History  Administered Date(s) Administered  . Influenza Split 06/05/2003, 04/05/2012, 03/23/2013  . Influenza, High Dose Seasonal PF 03/27/2014, 04/04/2017, 03/20/2018, 02/28/2019, 03/09/2020  . Influenza-Unspecified 03/10/2015, 03/23/2020  . PFIZER(Purple Top)SARS-COV-2 Vaccination 07/15/2019, 08/05/2019, 04/02/2020  . Pneumococcal Conjugate-13 08/04/2015  . Pneumococcal Polysaccharide-23 09/16/2005  . Tdap 03/17/2006  . Zoster 08/22/2012   TDAP status: Due, Education has been provided regarding  the importance of this vaccine. Advised may receive this vaccine at local pharmacy or Health Dept. Aware to provide a copy of the vaccination record if obtained from local pharmacy or Health Dept. Verbalized acceptance and understanding.  Deferred.    Health Maintenance Health Maintenance  Topic Date Due  . DEXA SCAN  Never done  . COVID-19 Vaccine (4 - Booster for Pfizer series) 09/30/2020  . URINE MICROALBUMIN  12/09/2020  . TETANUS/TDAP  10/19/2021 (Originally 03/17/2016)  . INFLUENZA VACCINE  02/01/2021  . FOOT EXAM  04/10/2021  . HEMOGLOBIN A1C  04/10/2021  . OPHTHALMOLOGY EXAM  08/13/2021  . PNA vac Low Risk Adult  Completed  . HPV VACCINES  Aged Out   Mammogram- deferred.   Covid vaccine booster #4- not yet received.   Lung Cancer Screening: (Low Dose CT Chest recommended if Age 36-80 years, 30 pack-year currently smoking OR have quit w/in 15years.) does not qualify.   Vision Screening: Recommended annual ophthalmology exams for early detection of glaucoma and other disorders of the eye. Is the patient up to date with their annual eye exam?  Yes   Dental Screening: Recommended annual dental exams for proper oral hygiene.  Community Resource Referral / Chronic Care Management: CRR required this visit?  No   CCM required this visit?  No      Plan:   Keep all routine maintenance appointments.   Follow up 04/12/21 @ 9:30  I have personally reviewed and noted the following in the patient's chart:   . Medical and social history . Use of alcohol, tobacco or illicit drugs  . Current medications and supplements . Functional ability and status . Nutritional status . Physical activity . Advanced directives . List of other physicians . Hospitalizations, surgeries, and ER visits in previous 12 months . Vitals . Screenings to include cognitive, depression, and falls . Referrals and appointments  In addition, I have reviewed and discussed with patient certain preventive  protocols, quality metrics, and best practice recommendations. A written personalized care plan for preventive services as well as general preventive health recommendations were provided to patient via mychart.     Varney Biles, LPN   [phone]

## 2020-10-19 NOTE — Telephone Encounter (Signed)
It was already sent to [facility].

## 2020-10-19 NOTE — Telephone Encounter (Signed)
Patient requests Dexa Scan referral order be discontinued with [facility] and sent to [facility].

## 2020-10-19 NOTE — Telephone Encounter (Signed)
Noted.  Will notify patient.

## 2020-10-19 NOTE — Patient Instructions (Addendum)
Ms. Erin Cook , Thank you for taking time to come for your Medicare Wellness Visit. I appreciate your ongoing commitment to your health goals. Please review the following plan we discussed and let me know if I can assist you in the future.   These are the goals we discussed: Goals    . Healthy Lifestyle     Healthy diet Stay active and continue walking for exercise Stay hydrated and drink plenty of fluids       This is a list of the screening recommended for you and due dates:  Health Maintenance  Topic Date Due  . DEXA scan (bone density measurement)  Never done  . COVID-19 Vaccine (4 - Booster for Pfizer series) 09/30/2020  . Urine Protein Check  12/09/2020  . Tetanus Vaccine  10/19/2021*  . Flu Shot  02/01/2021  . Complete foot exam   04/10/2021  . Hemoglobin A1C  04/10/2021  . Eye exam for diabetics  08/13/2021  . Pneumonia vaccines  Completed  . HPV Vaccine  Aged Out  *Topic was postponed. The date shown is not the original due date.    Advanced directives: End of life planning; Advance aging; Advanced directives discussed.  Copy of current HCPOA/Living Will requested.    Conditions/risks identified: none new  Follow up in one year for your annual wellness visit.   Preventive Care 35 Years and Older, Female Preventive care refers to lifestyle choices and visits with your health care provider that can promote health and wellness. What does preventive care include?  A yearly physical exam. This is also called an annual well check.  Dental exams once or twice a year.  Routine eye exams. Ask your health care provider how often you should have your eyes checked.  Personal lifestyle choices, including:  Daily care of your teeth and gums.  Regular physical activity.  Eating a healthy diet.  Avoiding tobacco and drug use.  Limiting alcohol use.  Practicing safe sex.  Taking low-dose aspirin every day.  Taking vitamin and mineral supplements as recommended by  your health care provider. What happens during an annual well check? The services and screenings done by your health care provider during your annual well check will depend on your age, overall health, lifestyle risk factors, and family history of disease. Counseling  Your health care provider may ask you questions about your:  Alcohol use.  Tobacco use.  Drug use.  Emotional well-being.  Home and relationship well-being.  Sexual activity.  Eating habits.  History of falls.  Memory and ability to understand (cognition).  Work and work Statistician.  Reproductive health. Screening  You may have the following tests or measurements:  Height, weight, and BMI.  Blood pressure.  Lipid and cholesterol levels. These may be checked every 5 years, or more frequently if you are over 83 years old.  Skin check.  Lung cancer screening. You may have this screening every year starting at age 45 if you have a 30-pack-year history of smoking and currently smoke or have quit within the past 15 years.  Fecal occult blood test (FOBT) of the stool. You may have this test every year starting at age 16.  Flexible sigmoidoscopy or colonoscopy. You may have a sigmoidoscopy every 5 years or a colonoscopy every 10 years starting at age 35.  Hepatitis C blood test.  Hepatitis B blood test.  Sexually transmitted disease (STD) testing.  Diabetes screening. This is done by checking your blood sugar (glucose) after you have  not eaten for a while (fasting). You may have this done every 1-3 years.  Bone density scan. This is done to screen for osteoporosis. You may have this done starting at age 8.  Mammogram. This may be done every 1-2 years. Talk to your health care provider about how often you should have regular mammograms. Talk with your health care provider about your test results, treatment options, and if necessary, the need for more tests. Vaccines  Your health care provider may  recommend certain vaccines, such as:  Influenza vaccine. This is recommended every year.  Tetanus, diphtheria, and acellular pertussis (Tdap, Td) vaccine. You may need a Td booster every 10 years.  Zoster vaccine. You may need this after age 58.  Pneumococcal 13-valent conjugate (PCV13) vaccine. One dose is recommended after age 39.  Pneumococcal polysaccharide (PPSV23) vaccine. One dose is recommended after age 48. Talk to your health care provider about which screenings and vaccines you need and how often you need them. This information is not intended to replace advice given to you by your health care provider. Make sure you discuss any questions you have with your health care provider. Document Released: 07/17/2015 Document Revised: 03/09/2016 Document Reviewed: 04/21/2015 Elsevier Interactive Patient Education  2017 [employer] Prevention in the Home Falls can cause injuries. They can happen to people of all ages. There are many things you can do to make your home safe and to help prevent falls. What can I do on the outside of my home?  Regularly fix the edges of walkways and driveways and fix any cracks.  Remove anything that might make you trip as you walk through a door, such as a raised step or threshold.  Trim any bushes or trees on the path to your home.  Use bright outdoor lighting.  Clear any walking paths of anything that might make someone trip, such as rocks or tools.  Regularly check to see if handrails are loose or broken. Make sure that both sides of any steps have handrails.  Any raised decks and porches should have guardrails on the edges.  Have any leaves, snow, or ice cleared regularly.  Use sand or salt on walking paths during winter.  Clean up any spills in your garage right away. This includes oil or grease spills. What can I do in the bathroom?  Use night lights.  Install grab bars by the toilet and in the tub and shower. Do not use towel  bars as grab bars.  Use non-skid mats or decals in the tub or shower.  If you need to sit down in the shower, use a plastic, non-slip stool.  Keep the floor dry. Clean up any water that spills on the floor as soon as it happens.  Remove soap buildup in the tub or shower regularly.  Attach bath mats securely with double-sided non-slip rug tape.  Do not have throw rugs and other things on the floor that can make you trip. What can I do in the bedroom?  Use night lights.  Make sure that you have a light by your bed that is easy to reach.  Do not use any sheets or blankets that are too big for your bed. They should not hang down onto the floor.  Have a firm chair that has side arms. You can use this for support while you get dressed.  Do not have throw rugs and other things on the floor that can make you trip. What can  I do in the kitchen?  Clean up any spills right away.  Avoid walking on wet floors.  Keep items that you use a lot in easy-to-reach places.  If you need to reach something above you, use a strong step stool that has a grab bar.  Keep electrical cords out of the way.  Do not use floor polish or wax that makes floors slippery. If you must use wax, use non-skid floor wax.  Do not have throw rugs and other things on the floor that can make you trip. What can I do with my stairs?  Do not leave any items on the stairs.  Make sure that there are handrails on both sides of the stairs and use them. Fix handrails that are broken or loose. Make sure that handrails are as long as the stairways.  Check any carpeting to make sure that it is firmly attached to the stairs. Fix any carpet that is loose or worn.  Avoid having throw rugs at the top or bottom of the stairs. If you do have throw rugs, attach them to the floor with carpet tape.  Make sure that you have a light switch at the top of the stairs and the bottom of the stairs. If you do not have them, ask someone to  add them for you. What else can I do to help prevent falls?  Wear shoes that:  Do not have high heels.  Have rubber bottoms.  Are comfortable and fit you well.  Are closed at the toe. Do not wear sandals.  If you use a stepladder:  Make sure that it is fully opened. Do not climb a closed stepladder.  Make sure that both sides of the stepladder are locked into place.  Ask someone to hold it for you, if possible.  Clearly mark and make sure that you can see:  Any grab bars or handrails.  First and last steps.  Where the edge of each step is.  Use tools that help you move around (mobility aids) if they are needed. These include:  Canes.  Walkers.  Scooters.  Crutches.  Turn on the lights when you go into a dark area. Replace any light bulbs as soon as they burn out.  Set up your furniture so you have a clear path. Avoid moving your furniture around.  If any of your floors are uneven, fix them.  If there are any pets around you, be aware of where they are.  Review your medicines with your doctor. Some medicines can make you feel dizzy. This can increase your chance of falling. Ask your doctor what other things that you can do to help prevent falls. This information is not intended to replace advice given to you by your health care provider. Make sure you discuss any questions you have with your health care provider. Document Released: 04/16/2009 Document Revised: 11/26/2015 Document Reviewed: 07/25/2014 Elsevier Interactive Patient Education  2017 [employer].

## 2020-12-09 DIAGNOSIS — G8929 Other chronic pain: Secondary | ICD-10-CM | POA: Diagnosis not present

## 2020-12-09 DIAGNOSIS — Z823 Family history of stroke: Secondary | ICD-10-CM | POA: Diagnosis not present

## 2020-12-09 DIAGNOSIS — H5360 Unspecified night blindness: Secondary | ICD-10-CM | POA: Diagnosis not present

## 2020-12-09 DIAGNOSIS — Z8249 Family history of ischemic heart disease and other diseases of the circulatory system: Secondary | ICD-10-CM | POA: Diagnosis not present

## 2020-12-09 DIAGNOSIS — E785 Hyperlipidemia, unspecified: Secondary | ICD-10-CM | POA: Diagnosis not present

## 2020-12-09 DIAGNOSIS — R030 Elevated blood-pressure reading, without diagnosis of hypertension: Secondary | ICD-10-CM | POA: Diagnosis not present

## 2020-12-09 DIAGNOSIS — E890 Postprocedural hypothyroidism: Secondary | ICD-10-CM | POA: Diagnosis not present

## 2020-12-09 DIAGNOSIS — Z833 Family history of diabetes mellitus: Secondary | ICD-10-CM | POA: Diagnosis not present

## 2020-12-09 DIAGNOSIS — Z7984 Long term (current) use of oral hypoglycemic drugs: Secondary | ICD-10-CM | POA: Diagnosis not present

## 2020-12-09 DIAGNOSIS — E1165 Type 2 diabetes mellitus with hyperglycemia: Secondary | ICD-10-CM | POA: Diagnosis not present

## 2020-12-12 ENCOUNTER — Other Ambulatory Visit: Payer: Self-pay | Admitting: Family Medicine

## 2020-12-12 DIAGNOSIS — E119 Type 2 diabetes mellitus without complications: Secondary | ICD-10-CM

## 2021-01-12 ENCOUNTER — Other Ambulatory Visit: Payer: Self-pay | Admitting: Family Medicine

## 2021-01-12 DIAGNOSIS — E039 Hypothyroidism, unspecified: Secondary | ICD-10-CM

## 2021-03-18 ENCOUNTER — Other Ambulatory Visit: Payer: Self-pay

## 2021-03-18 DIAGNOSIS — E119 Type 2 diabetes mellitus without complications: Secondary | ICD-10-CM

## 2021-03-18 MED ORDER — METFORMIN HCL 500 MG PO TABS: 1000.0000 mg | ORAL_TABLET | Freq: Two times a day (BID) | ORAL | 1 refills | Status: DC

## 2021-04-12 ENCOUNTER — Other Ambulatory Visit: Payer: Self-pay

## 2021-04-12 ENCOUNTER — Ambulatory Visit (INDEPENDENT_AMBULATORY_CARE_PROVIDER_SITE_OTHER): Payer: Medicare HMO | Admitting: Family Medicine

## 2021-04-12 VITALS — BP 120/78 | HR 75 | Temp 98.6°F | Ht 63.0 in | Wt 114.6 lb

## 2021-04-12 DIAGNOSIS — E7800 Pure hypercholesterolemia, unspecified: Secondary | ICD-10-CM | POA: Diagnosis not present

## 2021-04-12 DIAGNOSIS — E119 Type 2 diabetes mellitus without complications: Secondary | ICD-10-CM

## 2021-04-12 DIAGNOSIS — M170 Bilateral primary osteoarthritis of knee: Secondary | ICD-10-CM

## 2021-04-12 DIAGNOSIS — E890 Postprocedural hypothyroidism: Secondary | ICD-10-CM | POA: Diagnosis not present

## 2021-04-12 DIAGNOSIS — Z8619 Personal history of other infectious and parasitic diseases: Secondary | ICD-10-CM

## 2021-04-12 LAB — MICROALBUMIN / CREATININE URINE RATIO
Creatinine,U: 157.4 mg/dL
Microalb Creat Ratio: 3 mg/g (ref 0.0–30.0)
Microalb, Ur: 4.8 mg/dL — ABNORMAL HIGH (ref 0.0–1.9)

## 2021-04-12 LAB — HEMOGLOBIN A1C: Hgb A1c MFr Bld: 7.1 % — ABNORMAL HIGH (ref 4.6–6.5)

## 2021-04-12 LAB — TSH: TSH: 1.39 u[IU]/mL (ref 0.35–5.50)

## 2021-04-12 NOTE — Assessment & Plan Note (Signed)
Check A1c.  Continue metformin 1000 mg twice daily.  Urine microalbumin to be checked today.

## 2021-04-12 NOTE — Patient Instructions (Signed)
Nice to see you. Please get the Shingrix vaccine. We will get lab work today and contact you with the results. Please try to continue walking for exercise.

## 2021-04-12 NOTE — Assessment & Plan Note (Signed)
Check TSH.  Continue Synthroid 62.5 mg daily.

## 2021-04-12 NOTE — Assessment & Plan Note (Signed)
Most recently well controlled.  She will continue Crestor 20 mg once daily.

## 2021-04-12 NOTE — Progress Notes (Signed)
[name], MD Phone: [phone]  Erin Cook is a 85 y.o. female who presents today for f/u.  DIABETES Disease Monitoring: Blood Sugar ranges-94 yesterday Polyuria/phagia/dipsia- no      Optho- UTD Medications: Compliance- taking metformin Hypoglycemic symptoms- no  HYPERLIPIDEMIA Symptoms Chest pain on exertion:  no   Leg claudication:   no Medications: Compliance- taking crestor Right upper quadrant pain- no  Muscle aches- no Lipid Panel     Component Value Date/Time   CHOL 134 10/09/2020 0925   CHOL 181 02/11/2015 0824   TRIG 79.0 10/09/2020 0925   HDL 68.40 10/09/2020 0925   HDL 65 02/11/2015 0824   CHOLHDL 2 10/09/2020 0925   VLDL 15.8 10/09/2020 0925   LDLCALC 50 10/09/2020 0925   LDLCALC 99 02/11/2015 0824   LDLDIRECT 64.0 01/17/2019 1354   LABVLDL 17 02/11/2015 0824   HYPOTHYROIDISM Disease Monitoring Weight changes: no  Skin Changes: dry skin though this is chronic Heat/Cold intolerance: chronic cold into[place]  Medication Monitoring Compliance:  taking synthroid   Last TSH:   Lab Results  Component Value Date   TSH 2.08 10/09/2020   History of shingles: Patient reports history of shingles 10 to 15 years ago.  Notes that occurred on her right face.  She has had tingling in her right upper lip and right cheek intermittently since then.  She does report having her eye checked at that time.  She wonders if she can get the Shingrix vaccine.  Arthritis: This is a chronic issue in her knees.  She follows with orthopedics.  She takes glucosamine/chondroitin with good benefit.  Social History   Tobacco Use  Smoking Status Never  Smokeless Tobacco Never    Current Outpatient Medications on File Prior to Visit  Medication Sig Dispense Refill   Ascorbic Acid (VITAMIN C) 1000 MG tablet Take 1,000 mg by mouth daily.     levothyroxine (SYNTHROID) 25 MCG tablet TAKE 2&1/2 TABLETS BY MOUTH DAILY WITH BREAKFAST 225 tablet 1   metFORMIN (GLUCOPHAGE) 500  MG tablet Take 2 tablets (1,000 mg total) by mouth 2 (two) times daily with a meal. 360 tablet 1   ONETOUCH ULTRA test strip USE TO TEST BLOOD SUGAR ONCE DAILY 100 strip 2   rosuvastatin (CRESTOR) 20 MG tablet TAKE 1 TABLET BY MOUTH EVERY DAY 90 tablet 3   vitamin B-12 (CYANOCOBALAMIN) 1000 MCG tablet Take 1,000 mcg by mouth daily.     No current facility-administered medications on file prior to visit.     ROS see history of present illness  Objective  Physical Exam Vitals:   04/12/21 0937  BP: 120/78  Pulse: 75  Temp: 98.6 F (37 C)  SpO2: 98%    BP Readings from Last 3 Encounters:  04/12/21 120/78  10/09/20 118/80  04/10/20 115/70   Wt Readings from Last 3 Encounters:  04/12/21 114 lb 9.6 oz (52 kg)  10/19/20 117 lb (53.1 kg)  10/09/20 117 lb 12.8 oz (53.4 kg)    Physical Exam Constitutional:      General: She is not in acute distress.    Appearance: She is not diaphoretic.  Cardiovascular:     Rate and Rhythm: Normal rate and regular rhythm.     Heart sounds: Normal heart sounds.  Pulmonary:     Effort: Pulmonary effort is normal.     Breath sounds: Normal breath sounds.  Skin:    General: Skin is warm and dry.  Neurological:     Mental Status: She is  alert.     Assessment/Plan: Please see individual problem list.  Problem List Items Addressed This Visit     Arthritis, degenerative    Chronic issue.  Stable.  She will monitor.      Diabetes mellitus, type 2 (HCC) - Primary    Check A1c.  Continue metformin 1000 mg twice daily.  Urine microalbumin to be checked today.      Relevant Orders   HgB A1c   Urine Microalbumin w/creat. ratio   History of shingles    Patient has chronic postherpetic neuralgia symptoms with some tingling in the V2 distribution of her right face.  Discussed the potential for trying gabapentin though she declines this.  She was encouraged to get the Shingrix vaccine.      Hypercholesteremia    Most recently well  controlled.  She will continue Crestor 20 mg once daily.      Hypothyroidism, postop    Check TSH.  Continue Synthroid 62.5 mg daily.      Relevant Orders   TSH     Return in about 6 months (around 10/11/2021) for Diabetes/hypothyroidism.  This visit occurred during the SARS-CoV-2 public health emergency.  Safety protocols were in place, including screening questions prior to the visit, additional usage of staff PPE, and extensive cleaning of exam room while observing appropriate contact time as indicated for disinfecting solutions.    [name], MD [place]

## 2021-04-12 NOTE — Assessment & Plan Note (Signed)
Patient has chronic postherpetic neuralgia symptoms with some tingling in the V2 distribution of her right face.  Discussed the potential for trying gabapentin though she declines this.  She was encouraged to get the Shingrix vaccine.

## 2021-04-12 NOTE — Assessment & Plan Note (Signed)
Chronic issue.  Stable.  She will monitor.

## 2021-04-20 ENCOUNTER — Encounter: Payer: Self-pay | Admitting: Family Medicine

## 2021-04-20 ENCOUNTER — Other Ambulatory Visit: Payer: Self-pay

## 2021-04-20 MED ORDER — ONETOUCH DELICA LANCETS 33G MISC: 1 refills | Status: DC

## 2021-06-07 ENCOUNTER — Telehealth: Payer: Self-pay | Admitting: Family Medicine

## 2021-06-07 DIAGNOSIS — M170 Bilateral primary osteoarthritis of knee: Secondary | ICD-10-CM | POA: Diagnosis not present

## 2021-06-07 NOTE — Telephone Encounter (Signed)
I generally defer this type of procedure to orthopedics.

## 2021-06-07 NOTE — Telephone Encounter (Signed)
Patient called and wanted to know if Dr Caryl Bis would give her a Cortizone in both knees. She has a call into her specialist also, to see if that office can give her the injection.   Nguyen Butler,cma

## 2021-06-07 NOTE — Telephone Encounter (Signed)
Patient called and wanted to know if Dr Caryl Bis would give her a Cortizone in both knees. She has a call into her specialist also, to see if that office can give her the injection.

## 2021-06-09 NOTE — Telephone Encounter (Signed)
I called and informed the patient that the provider stated he referred patients to ortho for injections and the patient stated she already took care of it.  Nikaya Nasby,cma

## 2021-06-14 DIAGNOSIS — M1712 Unilateral primary osteoarthritis, left knee: Secondary | ICD-10-CM | POA: Diagnosis not present

## 2021-07-21 DIAGNOSIS — L300 Nummular dermatitis: Secondary | ICD-10-CM | POA: Diagnosis not present

## 2021-07-21 DIAGNOSIS — L218 Other seborrheic dermatitis: Secondary | ICD-10-CM | POA: Diagnosis not present

## 2021-07-21 DIAGNOSIS — L821 Other seborrheic keratosis: Secondary | ICD-10-CM | POA: Diagnosis not present

## 2021-09-15 DIAGNOSIS — M170 Bilateral primary osteoarthritis of knee: Secondary | ICD-10-CM | POA: Diagnosis not present

## 2021-09-17 ENCOUNTER — Ambulatory Visit (INDEPENDENT_AMBULATORY_CARE_PROVIDER_SITE_OTHER): Payer: No Typology Code available for payment source | Admitting: Family Medicine

## 2021-09-17 ENCOUNTER — Other Ambulatory Visit: Payer: Self-pay

## 2021-09-17 ENCOUNTER — Encounter: Payer: Self-pay | Admitting: Family Medicine

## 2021-09-17 VITALS — BP 130/60 | HR 70 | Temp 97.9°F | Ht 63.0 in | Wt 113.2 lb

## 2021-09-17 DIAGNOSIS — Z853 Personal history of malignant neoplasm of breast: Secondary | ICD-10-CM | POA: Diagnosis not present

## 2021-09-17 DIAGNOSIS — T148XXA Other injury of unspecified body region, initial encounter: Secondary | ICD-10-CM | POA: Diagnosis not present

## 2021-09-17 DIAGNOSIS — E890 Postprocedural hypothyroidism: Secondary | ICD-10-CM

## 2021-09-17 DIAGNOSIS — R634 Abnormal weight loss: Secondary | ICD-10-CM | POA: Insufficient documentation

## 2021-09-17 DIAGNOSIS — R8290 Unspecified abnormal findings in urine: Secondary | ICD-10-CM | POA: Diagnosis not present

## 2021-09-17 DIAGNOSIS — M170 Bilateral primary osteoarthritis of knee: Secondary | ICD-10-CM

## 2021-09-17 DIAGNOSIS — E119 Type 2 diabetes mellitus without complications: Secondary | ICD-10-CM | POA: Diagnosis not present

## 2021-09-17 DIAGNOSIS — R6889 Other general symptoms and signs: Secondary | ICD-10-CM | POA: Diagnosis not present

## 2021-09-17 DIAGNOSIS — N3281 Overactive bladder: Secondary | ICD-10-CM

## 2021-09-17 DIAGNOSIS — E7800 Pure hypercholesterolemia, unspecified: Secondary | ICD-10-CM | POA: Diagnosis not present

## 2021-09-17 LAB — CBC WITH DIFFERENTIAL/PLATELET
Basophils Absolute: 0 10*3/uL (ref 0.0–0.1)
Basophils Relative: 0.1 % (ref 0.0–3.0)
Eosinophils Absolute: 0.1 10*3/uL (ref 0.0–0.7)
Eosinophils Relative: 0.9 % (ref 0.0–5.0)
HCT: 42.3 % (ref 36.0–46.0)
Hemoglobin: 13.9 g/dL (ref 12.0–15.0)
Lymphocytes Relative: 10.3 % — ABNORMAL LOW (ref 12.0–46.0)
Lymphs Abs: 1.3 10*3/uL (ref 0.7–4.0)
MCHC: 32.9 g/dL (ref 30.0–36.0)
MCV: 99.1 fl (ref 78.0–100.0)
Monocytes Absolute: 0.8 10*3/uL (ref 0.1–1.0)
Monocytes Relative: 6.1 % (ref 3.0–12.0)
Neutro Abs: 10.5 10*3/uL — ABNORMAL HIGH (ref 1.4–7.7)
Neutrophils Relative %: 82.6 % — ABNORMAL HIGH (ref 43.0–77.0)
Platelets: 205 10*3/uL (ref 150.0–400.0)
RBC: 4.27 Mil/uL (ref 3.87–5.11)
RDW: 13.8 % (ref 11.5–15.5)
WBC: 12.7 10*3/uL — ABNORMAL HIGH (ref 4.0–10.5)

## 2021-09-17 LAB — COMPREHENSIVE METABOLIC PANEL
ALT: 16 U/L (ref 0–35)
AST: 22 U/L (ref 0–37)
Albumin: 4.8 g/dL (ref 3.5–5.2)
Alkaline Phosphatase: 46 U/L (ref 39–117)
BUN: 14 mg/dL (ref 6–23)
CO2: 26 mEq/L (ref 19–32)
Calcium: 9.8 mg/dL (ref 8.4–10.5)
Chloride: 102 mEq/L (ref 96–112)
Creatinine, Ser: 0.63 mg/dL (ref 0.40–1.20)
GFR: 82.09 mL/min (ref >60.00)
Glucose, Bld: 139 mg/dL — ABNORMAL HIGH (ref 70–99)
Potassium: 3.6 mEq/L (ref 3.5–5.1)
Sodium: 139 mEq/L (ref 135–145)
Total Bilirubin: 0.5 mg/dL (ref 0.2–1.2)
Total Protein: 7.3 g/dL (ref 6.0–8.3)

## 2021-09-17 LAB — LIPID PANEL
Cholesterol: 132 mg/dL (ref 0–200)
HDL: 74.4 mg/dL (ref >39.00)
LDL Cholesterol: 42 mg/dL (ref 0–99)
NonHDL: 57.27
Total CHOL/HDL Ratio: 2
Triglycerides: 75 mg/dL (ref 0.0–149.0)
VLDL: 15 mg/dL (ref 0.0–40.0)

## 2021-09-17 LAB — POCT URINALYSIS DIPSTICK
Bilirubin, UA: NEGATIVE
Glucose, UA: NEGATIVE
Ketones, UA: NEGATIVE
Leukocytes, UA: NEGATIVE
Nitrite, UA: NEGATIVE
Protein, UA: NEGATIVE
Spec Grav, UA: <=1.005 — AB (ref 1.010–1.025)
Urobilinogen, UA: 0.2 E.U./dL
pH, UA: 5 (ref 5.0–8.0)

## 2021-09-17 LAB — URINALYSIS, MICROSCOPIC ONLY

## 2021-09-17 LAB — TSH: TSH: 1.13 u[IU]/mL (ref 0.35–5.50)

## 2021-09-17 LAB — HEMOGLOBIN A1C: Hgb A1c MFr Bld: 7.5 % — ABNORMAL HIGH (ref 4.6–6.5)

## 2021-09-17 NOTE — Assessment & Plan Note (Signed)
The patient's weight has been relatively stable over the last several years.  I suspect that her prior weight loss was related to dietary changes.  We are checking lab work as outlined.  I encouraged her to eat some higher calorie foods if she would like to try to gain some weight. ?

## 2021-09-17 NOTE — Assessment & Plan Note (Signed)
Benign exam.  We will continue to periodically monitor.  Given history of mastectomy imaging is not indicated. ?

## 2021-09-17 NOTE — Assessment & Plan Note (Signed)
Improved after injections with orthopedics.  She will continue to see her orthopedist. ?

## 2021-09-17 NOTE — Assessment & Plan Note (Signed)
Patient's urinary symptoms are likely related to overactive bladder.  We will check a urinalysis and if there are no signs of infection we will plan on starting on Gemtesa.  If this is not affordable we can consider one of the older cheaper medications I did discuss the risk of dry mouth, constipation, dry eyes, and confusion with the older medications. ?

## 2021-09-17 NOTE — Assessment & Plan Note (Signed)
Check A1c.  She will continue metformin 1000 mg twice daily. 

## 2021-09-17 NOTE — Assessment & Plan Note (Signed)
Check TSH.  She will continue 62.5 mg of levothyroxine daily. ?

## 2021-09-17 NOTE — Assessment & Plan Note (Signed)
Check lipid panel.  She will continue Crestor 20 mg once daily. ?

## 2021-09-17 NOTE — Patient Instructions (Signed)
Nice to see you. ?We will  check labs today and contact you with there results.  ?We will check your urine. If there is not any infection I will send in [place] for you to start on. If this is expensive please let me know and we can start with a cheaper option.  ?

## 2021-09-17 NOTE — Assessment & Plan Note (Signed)
Discussed that this is likely related to thinning of her skin related to age.  We will check a CBC to evaluate for any hematologic abnormalities that could be contributing. ?

## 2021-09-17 NOTE — Progress Notes (Signed)
?[name], MD ?Phone: [phone] ? ?Erin Cook is a 86 y.o. female who presents today for f/u. ? ?HYPOTHYROIDISM ?Disease Monitoring ?Weight changes: down slightly  Skin Changes: notes dry skin Heat/Cold intolerance: chronic cold intolerance ? ?Medication Monitoring ?Compliance:  taking synthroid 62.5 mg daily   ?Last TSH:   ?Lab Results  ?Component Value Date  ? TSH 1.39 04/12/2021  ? ?DIABETES ?Disease Monitoring: ?Blood Sugar ranges-103-129 Polyuria/phagia/dipsia- notes nocturia and polyuria      Optho- scheduled ?Medications: ?Compliance- taking metformin Hypoglycemic symptoms- no ? ?History of breast cancer: Patient reports bilateral mastectomies.  She has had implants placed.  She notes no changes. ? ?Arthritis: She had injections in both of her knees earlier this week.  They were beneficial.  She takes glucosamine/chondroitin at the advice of her orthopedist. ? ?Urinary frequency/urgency: Patient notes this has been an ongoing issue.  She gets up 4-5 times at night.  She also pees frequently during the day.  She notes urinary urgency.  No dysuria or hematuria. ? ?Bruises: Patient reports bruising on her arms and legs is much easier than it has been in the past.  Only occurs if she bumps herself.  She does not take aspirin. ? ?Weight loss: Patient reports she weighs less that she would like.  She changed her diet quite a bit sometime ago.  Now she has cereal with skim milk for breakfast.  She has hotdogs or hamburger or sandwich for lunch.  She has a normal meal for supper.  No snacking.  Occasionally has dessert.  She used to eat lots of cakes and pies.  She notes no depression or anxiety.  No abdominal pain.  No blood in her stool.  She had some nausea on Wednesday and vomited once with that though otherwise does not have any nausea or vomiting.  She notes no widespread night sweats.  She reports occasionally her hair will be a little wet. ? ?Social History  ? ?Tobacco Use  ?Smoking Status  Never  ?Smokeless Tobacco Never  ? ? ?Current Outpatient Medications on File Prior to Visit  ?Medication Sig Dispense Refill  ? Ascorbic Acid (VITAMIN C) 1000 MG tablet Take 1,000 mg by mouth daily.    ? levothyroxine (SYNTHROID) 25 MCG tablet TAKE 2&1/2 TABLETS BY MOUTH DAILY WITH BREAKFAST 225 tablet 1  ? metFORMIN (GLUCOPHAGE) 500 MG tablet Take 2 tablets (1,000 mg total) by mouth 2 (two) times daily with a meal. 360 tablet 1  ? OneTouch Delica Lancets 78L MISC Used to check blood sugar one time a day. 100 each 1  ? ONETOUCH ULTRA test strip USE TO TEST BLOOD SUGAR ONCE DAILY 100 strip 2  ? rosuvastatin (CRESTOR) 20 MG tablet TAKE 1 TABLET BY MOUTH EVERY DAY 90 tablet 3  ? vitamin B-12 (CYANOCOBALAMIN) 1000 MCG tablet Take 1,000 mcg by mouth daily.    ? ?No current facility-administered medications on file prior to visit.  ? ? ? ?ROS see history of present illness ? ?Objective ? ?Physical Exam ?Vitals:  ? 09/17/21 0959  ?BP: 130/60  ?Pulse: 70  ?Temp: 97.9 ?F (36.6 ?C)  ?SpO2: 99%  ? ? ?BP Readings from Last 3 Encounters:  ?09/17/21 130/60  ?04/12/21 120/78  ?10/09/20 118/80  ? ?Wt Readings from Last 3 Encounters:  ?09/17/21 113 lb 3.2 oz (51.3 kg)  ?04/12/21 114 lb 9.6 oz (52 kg)  ?10/19/20 117 lb (53.1 kg)  ? ? ?Physical Exam ?Constitutional:   ?   General: She is  not in acute distress. ?   Appearance: She is not diaphoretic.  ?HENT:  ?   Head: Normocephalic and atraumatic.  ?Cardiovascular:  ?   Rate and Rhythm: Normal rate and regular rhythm.  ?   Heart sounds: Normal heart sounds.  ?Pulmonary:  ?   Effort: Pulmonary effort is normal.  ?   Breath sounds: Normal breath sounds.  ?Chest:  ?   Comments: Patient is status postmastectomy with implants, no palpable lesions externally with her implants, Fulton Mole, CMA served as chaperone ?Abdominal:  ?   General: Bowel sounds are normal. There is no distension.  ?   Palpations: Abdomen is soft.  ?   Tenderness: There is no abdominal tenderness.  ?Musculoskeletal:   ?   Right lower leg: No edema.  ?   Left lower leg: No edema.  ?Lymphadenopathy:  ?   Cervical: No cervical adenopathy.  ?   Upper Body:  ?   Right upper body: No supraclavicular adenopathy.  ?   Left upper body: No supraclavicular adenopathy.  ?Skin: ?   General: Skin is warm and dry.  ?Neurological:  ?   Mental Status: She is alert.  ? ? ? ?Assessment/Plan: Please see individual problem list. ? ?Problem List Items Addressed This Visit   ? ? Arthritis, degenerative  ?  Improved after injections with orthopedics.  She will continue to see her orthopedist. ?  ?  ? Bruising  ?  Discussed that this is likely related to thinning of her skin related to age.  We will check a CBC to evaluate for any hematologic abnormalities that could be contributing. ?  ?  ? Relevant Orders  ? CBC w/Diff  ? Change in weight  ?  The patient's weight has been relatively stable over the last several years.  I suspect that her prior weight loss was related to dietary changes.  We are checking lab work as outlined.  I encouraged her to eat some higher calorie foods if she would like to try to gain some weight. ?  ?  ? Diabetes mellitus, type 2 ([place]) - Primary  ?  Check A1c.  She will continue metformin 1000 mg twice daily. ?  ?  ? Relevant Orders  ? HgB A1c  ? History of breast cancer  ?  Benign exam.  We will continue to periodically monitor.  Given history of mastectomy imaging is not indicated. ?  ?  ? Hypercholesteremia  ?  Check lipid panel.  She will continue Crestor 20 mg once daily. ?  ?  ? Relevant Orders  ? Comp Met (CMET)  ? Lipid panel  ? Hypothyroidism, postop  ?  Check TSH.  She will continue 62.5 mg of levothyroxine daily. ?  ?  ? Relevant Orders  ? TSH  ? OAB (overactive bladder)  ?  Patient's urinary symptoms are likely related to overactive bladder.  We will check a urinalysis and if there are no signs of infection we will plan on starting on Gemtesa.  If this is not affordable we can consider one of the older cheaper  medications I did discuss the risk of dry mouth, constipation, dry eyes, and confusion with the older medications. ?  ?  ? Relevant Orders  ? POCT Urinalysis Dipstick (Completed)  ? ? ?Return in about 6 months (around 03/20/2022). ? ?This visit occurred during the SARS-CoV-2 public health emergency.  Safety protocols were in place, including screening questions prior to the visit, additional usage  of staff PPE, and extensive cleaning of exam room while observing appropriate contact time as indicated for disinfecting solutions.  ? ?I have spent 43 minutes in the care of this patient regarding history taking, documentation, discussion of plan, completion of exam, placing orders. ? ? ?[name], MD ?[place] ? ?

## 2021-09-19 ENCOUNTER — Encounter: Payer: Self-pay | Admitting: Family Medicine

## 2021-09-19 LAB — URINE CULTURE
MICRO NUMBER:: 13145876
Result:: NO GROWTH
SPECIMEN QUALITY:: ADEQUATE

## 2021-09-20 ENCOUNTER — Other Ambulatory Visit: Payer: Self-pay | Admitting: Family Medicine

## 2021-09-20 MED ORDER — BLOOD GLUCOSE MONITOR KIT: PACK | 0 refills | Status: DC

## 2021-09-20 MED ORDER — GLUCOSE BLOOD VI STRP: ORAL_STRIP | 3 refills | Status: DC

## 2021-09-20 MED ORDER — GEMTESA 75 MG PO TABS: 75.0000 mg | ORAL_TABLET | Freq: Every day | ORAL | 2 refills | Status: DC

## 2021-09-22 ENCOUNTER — Other Ambulatory Visit: Payer: Self-pay | Admitting: Family Medicine

## 2021-09-22 ENCOUNTER — Encounter: Payer: Self-pay | Admitting: Family Medicine

## 2021-09-22 DIAGNOSIS — D72829 Elevated white blood cell count, unspecified: Secondary | ICD-10-CM

## 2021-09-22 DIAGNOSIS — E119 Type 2 diabetes mellitus without complications: Secondary | ICD-10-CM

## 2021-09-23 ENCOUNTER — Other Ambulatory Visit: Payer: Self-pay

## 2021-09-23 DIAGNOSIS — E119 Type 2 diabetes mellitus without complications: Secondary | ICD-10-CM

## 2021-09-23 MED ORDER — GLUCOSE BLOOD VI STRP: ORAL_STRIP | 3 refills | Status: DC

## 2021-09-27 NOTE — Telephone Encounter (Signed)
Can you call the patients pharmacy and see what they got on their end for this prescription? It was sent in for 300 strips per 90 days.  ?

## 2021-09-30 ENCOUNTER — Encounter: Payer: Self-pay | Admitting: Family Medicine

## 2021-09-30 DIAGNOSIS — E119 Type 2 diabetes mellitus without complications: Secondary | ICD-10-CM

## 2021-10-01 MED ORDER — GLUCOSE BLOOD VI STRP: ORAL_STRIP | 3 refills | Status: DC

## 2021-10-06 ENCOUNTER — Other Ambulatory Visit (INDEPENDENT_AMBULATORY_CARE_PROVIDER_SITE_OTHER): Payer: No Typology Code available for payment source

## 2021-10-06 DIAGNOSIS — D72829 Elevated white blood cell count, unspecified: Secondary | ICD-10-CM

## 2021-10-06 LAB — CBC WITH DIFFERENTIAL/PLATELET
Basophils Absolute: 0 10*3/uL (ref 0.0–0.1)
Basophils Relative: 0.4 % (ref 0.0–3.0)
Eosinophils Absolute: 0 10*3/uL (ref 0.0–0.7)
Eosinophils Relative: 0.7 % (ref 0.0–5.0)
HCT: 43 % (ref 36.0–46.0)
Hemoglobin: 14 g/dL (ref 12.0–15.0)
Lymphocytes Relative: 27 % (ref 12.0–46.0)
Lymphs Abs: 1.9 10*3/uL (ref 0.7–4.0)
MCHC: 32.7 g/dL (ref 30.0–36.0)
MCV: 99.9 fl (ref 78.0–100.0)
Monocytes Absolute: 0.6 10*3/uL (ref 0.1–1.0)
Monocytes Relative: 8.3 % (ref 3.0–12.0)
Neutro Abs: 4.5 10*3/uL (ref 1.4–7.7)
Neutrophils Relative %: 63.6 % (ref 43.0–77.0)
Platelets: 201 10*3/uL (ref 150.0–400.0)
RBC: 4.3 Mil/uL (ref 3.87–5.11)
RDW: 14.3 % (ref 11.5–15.5)
WBC: 7 10*3/uL (ref 4.0–10.5)

## 2021-10-09 ENCOUNTER — Other Ambulatory Visit: Payer: Self-pay | Admitting: Family Medicine

## 2021-10-09 DIAGNOSIS — E039 Hypothyroidism, unspecified: Secondary | ICD-10-CM

## 2021-10-11 ENCOUNTER — Ambulatory Visit: Payer: Medicare HMO | Admitting: Family Medicine

## 2021-10-20 ENCOUNTER — Ambulatory Visit: Payer: Self-pay

## 2021-10-21 DIAGNOSIS — H35372 Puckering of macula, left eye: Secondary | ICD-10-CM | POA: Diagnosis not present

## 2021-12-17 ENCOUNTER — Ambulatory Visit: Payer: No Typology Code available for payment source

## 2021-12-20 ENCOUNTER — Ambulatory Visit (INDEPENDENT_AMBULATORY_CARE_PROVIDER_SITE_OTHER): Payer: No Typology Code available for payment source

## 2021-12-20 VITALS — Ht 63.0 in | Wt 113.0 lb

## 2021-12-20 DIAGNOSIS — Z0000 Encounter for general adult medical examination without abnormal findings: Secondary | ICD-10-CM

## 2021-12-20 NOTE — Progress Notes (Signed)
Subjective:   Erin Cook is a 86 y.o. female who presents for Medicare Annual (Subsequent) preventive examination.  Review of Systems    No ROS.  Medicare Wellness Virtual Visit.  Visual/audio telehealth visit, UTA vital signs.   See social history for additional risk factors.   Cardiac Risk Factors include: advanced age (>11mn, >>59women);diabetes mellitus     Objective:    Today's Vitals   12/20/21 1025  Weight: 113 lb (51.3 kg)  Height: '5\' 3"'  (1.6 m)   Body mass index is 20.02 kg/m.     12/20/2021   10:30 AM 10/19/2020    9:16 AM 10/09/2019   12:49 PM 12/11/2017    7:23 AM 11/21/2016   10:11 AM 11/26/2015    7:23 AM  Advanced Directives  Does Patient Have a Medical Advance Directive? Yes Yes Yes Yes Yes Yes  Type of AParamed[place]of [place] will [place]Living will [place]Living will [place]Living will H[place]emLiving will   Does patient want to make changes to medical advance directive? No - Patient declined No - Patient declined No - Patient declined No - Patient declined No - Patient declined   Copy of [place] Chart? No - copy requested No - copy requested No - copy requested No - copy requested No - copy requested No - copy requested    Current Medications (verified) Outpatient Encounter Medications as of 12/20/2021  Medication Sig   Vibegron (GEMTESA) 75 MG TABS Take 75 mg by mouth daily.   Ascorbic Acid (VITAMIN C) 1000 MG tablet Take 1,000 mg by mouth daily.   blood glucose meter kit and supplies KIT Dispense based on patient and insurance preference. One Touch Verio Flex Meter  [phone]. Use once daily. Dx E11.9.   glucose blood test strip Use three times daily. One touch verio strips.   levothyroxine (SYNTHROID) 25 MCG tablet TAKE 2&1/2 TABLETS BY MOUTH DAILY WITH BREAKFAST   metFORMIN (GLUCOPHAGE) 500 MG tablet  Take 2 tablets (1,000 mg total) by mouth 2 (two) times daily with a meal.   OneTouch Delica Lancets 316RMISC Used to check blood sugar one time a day.   rosuvastatin (CRESTOR) 20 MG tablet TAKE 1 TABLET BY MOUTH EVERY DAY   vitamin B-12 (CYANOCOBALAMIN) 1000 MCG tablet Take 1,000 mcg by mouth daily.   No facility-administered encounter medications on file as of 12/20/2021.    Allergies (verified) Patient has no known allergies.   History: Past Medical History:  Diagnosis Date   Arthritis    hands   Car sickness    Diabetes mellitus without complication ([place]    GERD (gastroesophageal reflux disease)    HOH (hard of hearing)    mild   Hyperlipidemia    Hypothyroidism    Past Surgical History:  Procedure Laterality Date   BREAST RECONSTRUCTION Bilateral 1978   CATARACT EXTRACTION W/PHACO Left 12/11/2017   Procedure: CATARACT EXTRACTION PHACO AND INTRAOCULAR LENS PLACEMENT ([place]  LEFT DIABETIC;  Surgeon: BLeandrew Koyanagi MD;  [facility]  Service: Ophthalmology;  Laterality: Left;  use left arm for BPs and IVs   CYST EXCISION  1980   In her chest.    MASTECTOMY Bilateral 1978   ROTATOR CUFF REPAIR Right July, 2015, Jan, 2016   Dr. KLisette Grinder  Dr.  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 Has one daughter, who lives in [place].      Work -  Worked for [employer]. Retired in 1996.      Hobbies - sewing      Diet - regular       Exercise - Walks at [place] Determinants of Health   Financial Resource Strain: Low Risk  (12/20/2021)   Overall Financial Resource Strain (CARDIA)    Difficulty of Paying Living Expenses: Not hard at all  Food Insecurity: No Food Insecurity (12/20/2021)   Hunger Vital Sign    Worried About Running Out of Food in the Last Year: Never true    Ran Out of Food in the Last Year: Never true  Transportation Needs: No Transportation Needs (12/20/2021)   PRAPARE - Hydrologist (Medical): No    Lack of Transportation (Non-Medical): No  Physical Activity: Sufficiently Active (12/20/2021)   Exercise Vital Sign    Days of Exercise per Week: 7 days    Minutes of Exercise per Session: 30 min  Stress: No Stress Concern Present (12/20/2021)   [name]    Feeling of Stress : Not at all  Social Connections: Unknown (12/20/2021)   Social Connection and Isolation Panel [NHANES]    Frequency of Communication with Friends and Family: More than three times a week    Frequency of Social Gatherings with Friends and Family: Not on file    Attends Religious Services: Not on file    Active Member of Clubs or Organizations: Not on file    Attends Archivist Meetings: Not on file    Marital Status: Married    Tobacco Counseling Counseling given: Not Answered   Clinical Intake:  Pre-visit preparation completed: Yes        Diabetes: Yes (Followed by PCP)  How often do you need to have someone help you when you read instructions, pamphlets, or other written materials from your doctor or pharmacy?: 1 - Never   Interpreter Needed?: No      Activities of Daily Living    12/20/2021   10:31 AM  In your present state of health, do you have any difficulty performing the following activities:   Hearing? 0  Vision? 0  Difficulty concentrating or making decisions? 0  Walking or climbing stairs? 0  Dressing or bathing? 0  Doing errands, shopping? 0  Preparing Food and eating ? N  Using the Toilet? N  In the past six months, have you accidently leaked urine? N  Do you have problems with loss of bowel control? N  Managing your Medications? N  Managing your Finances? N  Housekeeping or managing your Housekeeping? N    Patient Care Team: Caryl Bis,  Angela Adam, MD as PCP - General (Family Medicine)  Indicate any recent Medical Services you may have received from other than [facility] providers in the past year (date may be approximate).     Assessment:   This is a routine wellness examination for [facility].  Virtual Visit via Telephone Note  I connected with  Erin Cook on 12/20/21 at  9:45 AM EDT by telephone and verified that I am speaking with the correct person using two identifiers.  Persons participating in the virtual visit: patient/Nurse Health Advisor   I discussed the limitations of performing an evaluation and management service by telehealth.We continued and completed visit with audio only. Some vital signs may be absent or patient reported.   Hearing/Vision screen Hearing Screening - Comments:: Patient is able to hear conversational tones without difficulty. No issues reported. Vision Screening - Comments:: Followed by [facility]  Wears corrective lenses, L eye only  Cataract extraction, bilateral  They have regular follow up with the ophthalmologist  Dietary issues and exercise activities discussed: Current Exercise Habits: Home exercise routine, Type of exercise: walking, Intensity: Mild Regular diet; tries to eat a healthy diet   Goals Addressed             This Visit's Progress    Maintain Healthy Lifestyle       Healthy diet. Stay active and continue walking for exercise. Stay hydrated and drink plenty of fluids.       Depression  Screen    12/20/2021   10:29 AM 09/17/2021   10:00 AM 04/12/2021    9:38 AM 10/19/2020    9:15 AM 10/09/2020    9:06 AM 04/10/2020    9:08 AM 12/10/2019   11:06 AM  PHQ 2/9 Scores  PHQ - 2 Score 0 0 0 0 0 0 0    Fall Risk    12/20/2021   10:31 AM 09/17/2021   10:00 AM 04/12/2021    9:38 AM 10/19/2020    9:18 AM 10/09/2020    9:06 AM  [place] in the past year? 0 0 0 0 0  Number falls in past yr:  0 0 0 0  Injury with Fall?  0     Risk for fall due to :  No Fall Risks     Follow up Falls evaluation completed Falls evaluation completed Falls evaluation completed  Falls evaluation completed    [place]: Home free of loose throw rugs in walkways, pet beds, electrical cords, etc? Yes  Adequate lighting in your home to reduce risk of falls? Yes   ASSISTIVE DEVICES UTILIZED TO PREVENT FALLS: Life alert? No  Use of a cane, walker or w/c? No   TIMED UP AND GO: Was the test performed? No .   Cognitive Function:  Patient is alert and oriented x3.  Enjoys reading and other brain health stimulating activities.       10/09/2019   12:56 PM 11/21/2016   10:31 AM  6CIT Screen  What Year? 0 points 0 points  What month? 0 points 0 points  What time? 0 points 0 points  Count back from 20 0 points 0 points  Months in reverse 0 points 0 points  Repeat phrase 0 points 0 points  Total Score 0 points 0 points    Immunizations Immunization History  Administered Date(s) Administered   Influenza Split 06/05/2003, 04/05/2012, 03/23/2013   Influenza, High Dose Seasonal PF 03/27/2014, 04/04/2017,  03/20/2018, 02/28/2019, 03/09/2020, 03/05/2021   Influenza-Unspecified 03/10/2015, 03/23/2020   PFIZER(Purple Top)SARS-COV-2 Vaccination 07/15/2019, 08/05/2019, 04/02/2020, 03/29/2021   Pneumococcal Conjugate-13 08/04/2015   Pneumococcal Polysaccharide-23 09/16/2005   Tdap 03/17/2006   Zoster Recombinat (Shingrix) 09/13/2021   Zoster, Live 08/22/2012     TDAP status: Due, Education has been provided regarding the importance of this vaccine. Advised may receive this vaccine at local pharmacy or Health Dept. Aware to provide a copy of the vaccination record if obtained from local pharmacy or Health Dept. Verbalized acceptance and understanding. Deferred.   Screening Tests Health Maintenance  Topic Date Due   COVID-19 Vaccine (5 - Booster for Pfizer series) 01/05/2022 (Originally 05/24/2021)   Zoster Vaccines- Shingrix (2 of 2) 03/22/2022 (Originally 11/08/2021)   DEXA SCAN  06/03/2022 (Originally 05/24/2003)   TETANUS/TDAP  12/21/2022 (Originally 03/17/2016)   INFLUENZA VACCINE  02/01/2022   HEMOGLOBIN A1C  03/20/2022   FOOT EXAM  04/12/2022   URINE MICROALBUMIN  04/12/2022   OPHTHALMOLOGY EXAM  10/22/2022   Pneumonia Vaccine 40+ Years old  Completed   HPV VACCINES  Aged Out   Health Maintenance There are no preventive care reminders to display for this patient.  Dexa Scan- deferred per patient.   Lung Cancer Screening: (Low Dose CT Chest recommended if Age 39-80 years, 30 pack-year currently smoking OR have quit w/in 15years.) does not qualify.   Hepatitis C Screening: does not qualify.  Vision Screening: Recommended annual ophthalmology exams for early detection of glaucoma and other disorders of the eye.  Dental Screening: Recommended annual dental exams for proper oral hygiene  Community Resource Referral / Chronic Care Management: CRR required this visit?  No   CCM required this visit?  No      Plan:   Keep all routine maintenance appointments.   I have personally reviewed and noted the following in the patient's chart:   Medical and social history Use of alcohol, tobacco or illicit drugs  Current medications and supplements including opioid prescriptions.  Functional ability and status Nutritional status Physical activity Advanced directives List of other physicians Hospitalizations, surgeries, and ER visits  in previous 12 months Vitals Screenings to include cognitive, depression, and falls Referrals and appointments  In addition, I have reviewed and discussed with patient certain preventive protocols, quality metrics, and best practice recommendations. A written personalized care plan for preventive services as well as general preventive health recommendations were provided to patient.     Varney Biles, LPN   0/[id number]

## 2021-12-20 NOTE — Patient Instructions (Addendum)
  Erin Cook , Thank you for taking time to come for your Medicare Wellness Visit. I appreciate your ongoing commitment to your health goals. Please review the following plan we discussed and let me know if I can assist you in the future.   These are the goals we discussed:  Goals      Maintain Healthy Lifestyle     Healthy diet. Stay active and continue walking for exercise. Stay hydrated and drink plenty of fluids.        This is a list of the screening recommended for you and due dates:  Health Maintenance  Topic Date Due   COVID-19 Vaccine (5 - Booster for Pfizer series) 01/05/2022*   Zoster (Shingles) Vaccine (2 of 2) 03/22/2022*   DEXA scan (bone density measurement)  06/03/2022*   Tetanus Vaccine  12/21/2022*   Flu Shot  02/01/2022   Hemoglobin A1C  03/20/2022   Complete foot exam   04/12/2022   Urine Protein Check  04/12/2022   Eye exam for diabetics  10/22/2022   Pneumonia Vaccine  Completed   HPV Vaccine  Aged Out  *Topic was postponed. The date shown is not the original due date.

## 2021-12-28 DIAGNOSIS — L821 Other seborrheic keratosis: Secondary | ICD-10-CM | POA: Diagnosis not present

## 2021-12-28 DIAGNOSIS — L218 Other seborrheic dermatitis: Secondary | ICD-10-CM | POA: Diagnosis not present

## 2021-12-28 DIAGNOSIS — L570 Actinic keratosis: Secondary | ICD-10-CM | POA: Diagnosis not present

## 2021-12-31 DIAGNOSIS — M25562 Pain in left knee: Secondary | ICD-10-CM | POA: Diagnosis not present

## 2021-12-31 DIAGNOSIS — M25561 Pain in right knee: Secondary | ICD-10-CM | POA: Diagnosis not present

## 2022-03-21 ENCOUNTER — Encounter: Payer: Self-pay | Admitting: Family Medicine

## 2022-03-21 ENCOUNTER — Ambulatory Visit
Admission: RE | Admit: 2022-03-21 | Discharge: 2022-03-21 | Disposition: A | Discharge status: Home / Self Care | Payer: No Typology Code available for payment source | Source: Ambulatory Visit | Attending: Family Medicine | Admitting: Family Medicine

## 2022-03-21 ENCOUNTER — Telehealth: Payer: Self-pay

## 2022-03-21 ENCOUNTER — Telehealth: Payer: Self-pay | Admitting: Family Medicine

## 2022-03-21 ENCOUNTER — Ambulatory Visit (INDEPENDENT_AMBULATORY_CARE_PROVIDER_SITE_OTHER): Payer: No Typology Code available for payment source | Admitting: Family Medicine

## 2022-03-21 VITALS — BP 90/60 | HR 58 | Temp 98.6°F | Ht 63.0 in | Wt 111.4 lb

## 2022-03-21 DIAGNOSIS — K5730 Diverticulosis of large intestine without perforation or abscess without bleeding: Secondary | ICD-10-CM | POA: Diagnosis not present

## 2022-03-21 DIAGNOSIS — R109 Unspecified abdominal pain: Secondary | ICD-10-CM | POA: Insufficient documentation

## 2022-03-21 DIAGNOSIS — R600 Localized edema: Secondary | ICD-10-CM | POA: Insufficient documentation

## 2022-03-21 DIAGNOSIS — K7689 Other specified diseases of liver: Secondary | ICD-10-CM | POA: Diagnosis not present

## 2022-03-21 DIAGNOSIS — R1010 Upper abdominal pain, unspecified: Secondary | ICD-10-CM | POA: Diagnosis not present

## 2022-03-21 DIAGNOSIS — C786 Secondary malignant neoplasm of retroperitoneum and peritoneum: Secondary | ICD-10-CM

## 2022-03-21 DIAGNOSIS — S51819A Laceration without foreign body of unspecified forearm, initial encounter: Secondary | ICD-10-CM | POA: Insufficient documentation

## 2022-03-21 DIAGNOSIS — E119 Type 2 diabetes mellitus without complications: Secondary | ICD-10-CM

## 2022-03-21 DIAGNOSIS — R350 Frequency of micturition: Secondary | ICD-10-CM

## 2022-03-21 DIAGNOSIS — E890 Postprocedural hypothyroidism: Secondary | ICD-10-CM | POA: Diagnosis not present

## 2022-03-21 DIAGNOSIS — S51812A Laceration without foreign body of left forearm, initial encounter: Secondary | ICD-10-CM

## 2022-03-21 LAB — POCT URINALYSIS DIPSTICK
Glucose, UA: POSITIVE — AB
Leukocytes, UA: NEGATIVE
Nitrite, UA: NEGATIVE
Protein, UA: POSITIVE — AB
Spec Grav, UA: 1.01 (ref 1.010–1.025)
Urobilinogen, UA: 0.2 E.U./dL
pH, UA: 5.5 (ref 5.0–8.0)

## 2022-03-21 LAB — URINALYSIS, MICROSCOPIC ONLY

## 2022-03-21 LAB — CBC WITH DIFFERENTIAL/PLATELET
Basophils Absolute: 0 10*3/uL (ref 0.0–0.1)
Basophils Relative: 0.3 % (ref 0.0–3.0)
Eosinophils Absolute: 0 10*3/uL (ref 0.0–0.7)
Eosinophils Relative: 0.3 % (ref 0.0–5.0)
HCT: 36.9 % (ref 36.0–46.0)
Hemoglobin: 12.4 g/dL (ref 12.0–15.0)
Lymphocytes Relative: 12.4 % (ref 12.0–46.0)
Lymphs Abs: 1 10*3/uL (ref 0.7–4.0)
MCHC: 33.6 g/dL (ref 30.0–36.0)
MCV: 96.9 fl (ref 78.0–100.0)
Monocytes Absolute: 0.7 10*3/uL (ref 0.1–1.0)
Monocytes Relative: 8.5 % (ref 3.0–12.0)
Neutro Abs: 6.5 10*3/uL (ref 1.4–7.7)
Neutrophils Relative %: 78.5 % — ABNORMAL HIGH (ref 43.0–77.0)
Platelets: 416 10*3/uL — ABNORMAL HIGH (ref 150.0–400.0)
RBC: 3.81 Mil/uL — ABNORMAL LOW (ref 3.87–5.11)
RDW: 13.5 % (ref 11.5–15.5)
WBC: 8.3 10*3/uL (ref 4.0–10.5)

## 2022-03-21 LAB — POCT I-STAT CREATININE: Creatinine, Ser: 0.4 mg/dL — ABNORMAL LOW (ref 0.44–1.00)

## 2022-03-21 LAB — COMPREHENSIVE METABOLIC PANEL
ALT: 15 U/L (ref 0–35)
AST: 24 U/L (ref 0–37)
Albumin: 3.6 g/dL (ref 3.5–5.2)
Alkaline Phosphatase: 55 U/L (ref 39–117)
BUN: 8 mg/dL (ref 6–23)
CO2: 26 mEq/L (ref 19–32)
Calcium: 9.1 mg/dL (ref 8.4–10.5)
Chloride: 99 mEq/L (ref 96–112)
Creatinine, Ser: 0.58 mg/dL (ref 0.40–1.20)
GFR: 83.45 mL/min (ref >60.00)
Glucose, Bld: 154 mg/dL — ABNORMAL HIGH (ref 70–99)
Potassium: 3.3 mEq/L — ABNORMAL LOW (ref 3.5–5.1)
Sodium: 139 mEq/L (ref 135–145)
Total Bilirubin: 0.4 mg/dL (ref 0.2–1.2)
Total Protein: 5.9 g/dL — ABNORMAL LOW (ref 6.0–8.3)

## 2022-03-21 LAB — HEMOGLOBIN A1C: Hgb A1c MFr Bld: 7.4 % — ABNORMAL HIGH (ref 4.6–6.5)

## 2022-03-21 LAB — MICROALBUMIN / CREATININE URINE RATIO
Creatinine,U: 112.9 mg/dL
Microalb Creat Ratio: 2.7 mg/g (ref 0.0–30.0)
Microalb, Ur: 3.1 mg/dL — ABNORMAL HIGH (ref 0.0–1.9)

## 2022-03-21 LAB — TSH: TSH: 5.23 u[IU]/mL (ref 0.35–5.50)

## 2022-03-21 LAB — BRAIN NATRIURETIC PEPTIDE: Pro B Natriuretic peptide (BNP): 112 pg/mL — ABNORMAL HIGH (ref 0.0–100.0)

## 2022-03-21 LAB — LIPASE: Lipase: 19 U/L (ref 11.0–59.0)

## 2022-03-21 MED ORDER — IOHEXOL 300 MG/ML  SOLN
75.0000 mL | Freq: Once | INTRAMUSCULAR | Status: AC | PRN
  Administered 2022-03-21: 75 mL via INTRAVENOUS

## 2022-03-21 NOTE — Assessment & Plan Note (Signed)
Check TSH.  She will continue levothyroxine 62.5 mcg daily.

## 2022-03-21 NOTE — Assessment & Plan Note (Signed)
Patient has had upper abdominal pain over the last month.  Discussed the differential includes infection, intra-abdominal cancer, or some other cause.  Discussed given the color of her urine and her abdominal pain my concern would be for cancer.  With her guarding and tenderness we will get a CT scan today or tomorrow.  We will get lab work today as well.  We will check a urinalysis.  If she has any worsening abdominal pain she will go to the emergency department.

## 2022-03-21 NOTE — Assessment & Plan Note (Signed)
Bilateral pitting edema noted.  No other symptoms to indicate a cardiac cause.  We will check a BNP.  Given that the swelling does not resolve with leg elevation we will check a ultrasound to evaluate for blood clot.

## 2022-03-21 NOTE — Telephone Encounter (Signed)
Lft pt vm to call ofc to sch STAT CT abdomen. thanks

## 2022-03-21 NOTE — Patient Instructions (Signed)
Nice to see you. We will get lab work and some imaging on you today to look for a cause of your symptoms. Please continue to cleanse your skin tear with soap and water.  You can use the nonstick pads to help protect the area. If you develop any worsening abdominal pain or any new symptoms such as blood in your stool, fevers, vomiting, worsening diarrhea, chest pain, or shortness of breath please seek medical attention in the emergency department.

## 2022-03-21 NOTE — Assessment & Plan Note (Signed)
Appears well-healing.  Discussed continued cleansing with soap and water.  Discussed use of nonstick pads to protect the area.

## 2022-03-21 NOTE — Assessment & Plan Note (Signed)
Check urinalysis. 

## 2022-03-21 NOTE — Progress Notes (Signed)
Erin Rumps, MD Phone: [phone]  Erin Cook August is a 86 y.o. female who presents today for follow-up.  Diabetes: Patient is on metformin.  She does note some polyuria.  No polydipsia.  No hypoglycemia.  Hypothyroidism: Patient is on Synthroid.  She notes no skin changes.  She does note some cold intolerance though this is chronic.  Leg swelling: Patient reports this has been going on a week.  She notes no orthopnea, PND, or shortness of breath.  She notes the swelling does not resolve overnight.  Skin tear: This is on her left forearm.  She notes her purse slid down her arm and produced a skin tear.  She has been keeping this covered with a nonstick pad.  Abdominal pain/navel nodule: Patient reports over the last month or so she has been having some abdominal pain in her upper abdomen.  She typically has bowel movements every 2 to 3 days though over the last couple of days she has had multiple loose stools.  She is been having loose stools for quite some time.  She notes the color of the stools is yellow.  She notes no blood in her stool.  No vomiting.  No nausea.  Urinary frequency: Patient notes this has been going on for a month or so as well.  She notes not much urine comes out.  She has had some discomfort in her lower abdomen with this as well.  No blood in her urine.  She notes her urine has been dark orange.  Social History   Tobacco Use  Smoking Status Never  Smokeless Tobacco Never    Current Outpatient Medications on File Prior to Visit  Medication Sig Dispense Refill   Ascorbic Acid (VITAMIN C) 1000 MG tablet Take 1,000 mg by mouth daily.     blood glucose meter kit and supplies KIT Dispense based on patient and insurance preference. One Touch Verio Flex Meter  [phone]). Use once daily. Dx E11.9. 1 each 0   glucose blood test strip Use three times daily. One touch verio strips. 300 each 3   levothyroxine (SYNTHROID) 25 MCG tablet TAKE 2&1/2 TABLETS BY  MOUTH DAILY WITH BREAKFAST 225 tablet 1   metFORMIN (GLUCOPHAGE) 500 MG tablet Take 2 tablets (1,000 mg total) by mouth 2 (two) times daily with a meal. 360 tablet 1   OneTouch Delica Lancets 77O MISC Used to check blood sugar one time a day. 100 each 1   rosuvastatin (CRESTOR) 20 MG tablet TAKE 1 TABLET BY MOUTH EVERY DAY 90 tablet 3   vitamin B-12 (CYANOCOBALAMIN) 1000 MCG tablet Take 1,000 mcg by mouth daily.     No current facility-administered medications on file prior to visit.     ROS see history of present illness  Objective  Physical Exam Vitals:   03/21/22 1006  BP: 90/60  Pulse: (!) 58  Temp: 98.6 F (37 C)  SpO2: 98%    BP Readings from Last 3 Encounters:  03/21/22 90/60  09/17/21 130/60  04/12/21 120/78   Wt Readings from Last 3 Encounters:  03/21/22 111 lb 6.4 oz (50.5 kg)  12/20/21 113 lb (51.3 kg)  09/17/21 113 lb 3.2 oz (51.3 kg)    Physical Exam Constitutional:      General: She is not in acute distress.    Appearance: She is not diaphoretic.  Cardiovascular:     Rate and Rhythm: Normal rate and regular rhythm.     Heart sounds: Normal heart sounds.  Pulmonary:  Effort: Pulmonary effort is normal.     Breath sounds: Normal breath sounds.  Abdominal:     General: Bowel sounds are normal. There is no distension.     Palpations: Abdomen is soft. There is no mass.     Tenderness: There is abdominal tenderness (Bilateral upper abdominal tenderness). There is guarding (Intermittent guarding). There is no rebound.     Hernia: No hernia is present.  Musculoskeletal:     Comments: Patient has 3+ pitting edema bilateral lower extremities to the lower shins  Skin:    General: Skin is warm and dry.  Neurological:     Mental Status: She is alert.      Assessment/Plan: Please see individual problem list.  Problem List Items Addressed This Visit     Diabetes mellitus, type 2 (HCC) (Chronic)    Check A1c.  She will continue metformin 1000 mg twice  daily.      Relevant Orders   HgB A1c   Urine Microalbumin w/creat. ratio   Hypothyroidism, postop (Chronic)    Check TSH.  She will continue levothyroxine 62.5 mcg daily.      Relevant Orders   TSH   Abdominal pain - Primary    Patient has had upper abdominal pain over the last month.  Discussed the differential includes infection, intra-abdominal cancer, or some other cause.  Discussed given the color of her urine and her abdominal pain my concern would be for cancer.  With her guarding and tenderness we will get a CT scan today or tomorrow.  We will get lab work today as well.  We will check a urinalysis.  If she has any worsening abdominal pain she will go to the emergency department.      Relevant Orders   CBC w/Diff   Comp Met (CMET)   Lipase   CT Abdomen Pelvis W Contrast   Bilateral lower extremity edema    Bilateral pitting edema noted.  No other symptoms to indicate a cardiac cause.  We will check a BNP.  Given that the swelling does not resolve with leg elevation we will check a ultrasound to evaluate for blood clot.      Relevant Orders   US Venous Img Lower Bilateral   B Nat Peptide   Skin tear of forearm without complication    Appears well-healing.  Discussed continued cleansing with soap and water.  Discussed use of nonstick pads to protect the area.      Urine frequency    Check urinalysis.      Relevant Orders   POCT Urinalysis Dipstick (Completed)   Urine Microscopic   Urine Culture    Return in about 2 weeks (around 04/04/2022).   Erin Rumps, MD [place]

## 2022-03-21 NOTE — Telephone Encounter (Signed)
I called the patient regarding her CT imaging findings.  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  Discussed she needs to see oncology to start a work-up for this.  Referral placed.  I discussed this with the referral coordinator to send the referral urgently.

## 2022-03-21 NOTE — Assessment & Plan Note (Signed)
Check A1c.  She will continue metformin 1000 mg twice daily.

## 2022-03-23 LAB — URINE CULTURE
MICRO NUMBER:: 13931210
SPECIMEN QUALITY:: ADEQUATE

## 2022-03-25 ENCOUNTER — Inpatient Hospital Stay: Payer: No Typology Code available for payment source | Attending: Oncology | Admitting: Oncology

## 2022-03-25 ENCOUNTER — Telehealth: Payer: Self-pay

## 2022-03-25 ENCOUNTER — Encounter: Payer: Self-pay | Admitting: Oncology

## 2022-03-25 ENCOUNTER — Ambulatory Visit
Admission: RE | Admit: 2022-03-25 | Discharge: 2022-03-25 | Disposition: A | Discharge status: Home / Self Care | Payer: No Typology Code available for payment source | Source: Ambulatory Visit | Attending: Oncology | Admitting: Oncology

## 2022-03-25 ENCOUNTER — Inpatient Hospital Stay: Payer: No Typology Code available for payment source

## 2022-03-25 ENCOUNTER — Other Ambulatory Visit: Payer: Self-pay

## 2022-03-25 VITALS — BP 128/95 | HR 88 | Temp 98.3°F | Resp 18 | Wt 110.6 lb

## 2022-03-25 DIAGNOSIS — K669 Disorder of peritoneum, unspecified: Secondary | ICD-10-CM | POA: Diagnosis not present

## 2022-03-25 DIAGNOSIS — M7989 Other specified soft tissue disorders: Secondary | ICD-10-CM

## 2022-03-25 DIAGNOSIS — C786 Secondary malignant neoplasm of retroperitoneum and peritoneum: Secondary | ICD-10-CM

## 2022-03-25 DIAGNOSIS — Z7189 Other specified counseling: Secondary | ICD-10-CM | POA: Diagnosis not present

## 2022-03-25 DIAGNOSIS — Z853 Personal history of malignant neoplasm of breast: Secondary | ICD-10-CM | POA: Diagnosis not present

## 2022-03-25 DIAGNOSIS — R634 Abnormal weight loss: Secondary | ICD-10-CM

## 2022-03-25 DIAGNOSIS — Z9011 Acquired absence of right breast and nipple: Secondary | ICD-10-CM | POA: Insufficient documentation

## 2022-03-25 DIAGNOSIS — R935 Abnormal findings on diagnostic imaging of other abdominal regions, including retroperitoneum: Secondary | ICD-10-CM | POA: Diagnosis not present

## 2022-03-25 DIAGNOSIS — Z79899 Other long term (current) drug therapy: Secondary | ICD-10-CM | POA: Insufficient documentation

## 2022-03-25 LAB — PROTIME-INR
INR: 1.1 (ref 0.8–1.2)
Prothrombin Time: 14.5 seconds (ref 11.4–15.2)

## 2022-03-25 LAB — LACTATE DEHYDROGENASE: LDH: 205 U/L — ABNORMAL HIGH (ref 98–192)

## 2022-03-25 LAB — APTT: aPTT: 29 seconds (ref 24–36)

## 2022-03-25 NOTE — Assessment & Plan Note (Signed)
Discussed with patient

## 2022-03-25 NOTE — Telephone Encounter (Signed)
Per LOS on 9/22:   CT guided biopsy -peritoneal carcinomatosis  MD 1 week after biopsy

## 2022-03-25 NOTE — Assessment & Plan Note (Addendum)
Images were reviewed by me and discussed with patient and daughter.  Metastasis to peritoneal cavity vs primary peritoneal carcinomatosis.   Recommend CT guided omental/peritoneal mass biopsy to establish tissue diagnosis.  Obtain PET scan for staging.  Check CA 27.29, CA 15.3, CEA, LDH, CA 125, PT, PTT

## 2022-03-25 NOTE — Progress Notes (Signed)
Patient here to establish care for peritoneal carcinomatosis. Pt has history of breast cancer, diagnosed in 1976. Copy of HCPOA sent to MR to scan.

## 2022-03-25 NOTE — Progress Notes (Signed)
Hematology/Oncology Consult Note [phone] [phone]     REFERRING PROVIDER: Leone Haven, MD   Patient Care Team: Leone Haven, MD as PCP - General (Family Medicine) Clent Jacks, RN as Oncology Nurse Navigator  CHIEF COMPLAINTS/PURPOSE OF CONSULTATION:  Peritoneal carcinomatosis  HISTORY OF PRESENTING ILLNESS:  Erin Cook 86 y.o. female presents to establish care for peritoneal carcinomatosis.  I have reviewed her chart and materials related to her cancer extensively and collaborated history with the patient. Summary of oncologic history is as follows: Oncology History  Peritoneal carcinomatosis ([place])  03/21/2022 Imaging   CT abdomen pelvis w contrast Extensive omental caking is noted in left upper quadrant consistent with peritoneal carcinomatosis. Peritoneal implants are also noted anteriorly in the upper abdomen as well as in the pelvis, also consistent with peritoneal carcinomatosis. Minimal ascites is noted.These results will be called to the ordering clinician or representative by the Radiologist Assistant, and communication documented in the PACS or zVision Dashboard. Sigmoid diverticulosis without inflammation. Pancreatic atrophy is noted.   03/21/2022 Initial Diagnosis   Peritoneal carcinomatosis ([place])  -Patient has noticed unintentional weight loss 10 to 15 pounds in the past 2 months, decreased appetite and oral intake.  Intermittent diarrhea and abdominal discomfort.    History of right breast cancer in 1976, s/p mastectomy and axillary lymph node dissection. She did not take any chemotherapy. She did not recall if she needed to take any endocrine therapy.     MEDICAL HISTORY:  Past Medical History:  Diagnosis Date   Arthritis    hands   Breast cancer ([place]) 1976   Car sickness    Diabetes mellitus without complication ([place])    GERD (gastroesophageal reflux disease)    HOH (hard of hearing)    mild    Hyperlipidemia    Hypothyroidism     SURGICAL HISTORY: Past Surgical History:  Procedure Laterality Date   BREAST RECONSTRUCTION Bilateral 1978   CATARACT EXTRACTION W/PHACO Left 12/11/2017   Procedure: CATARACT EXTRACTION PHACO AND INTRAOCULAR LENS PLACEMENT ([place])  LEFT DIABETIC;  Surgeon: Leandrew Koyanagi, MD;  Location: [facility];  Service: Ophthalmology;  Laterality: Left;  use left arm for BPs and IVs   CYST EXCISION  1980   In her chest.    MASTECTOMY Bilateral 1978   ROTATOR CUFF REPAIR Right July, 2015, Jan, 2016   Dr. Lisette Grinder.  Dr.  Amedeo Plenty.    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 Has one daughter, who lives in [place].      Work - Worked for [employer]. Retired in 1996.      Hobbies - sewing      Diet - regular       Exercise - Walks at [place] Determinants of Health   Financial Resource Strain: Low Risk  (12/20/2021)  Overall Financial Resource Strain (CARDIA)    Difficulty of Paying Living Expenses: Not hard at all  Food Insecurity: No Food Insecurity (12/20/2021)   Hunger Vital Sign    Worried About Running Out of Food in the Last Year: Never true    Ran Out of Food in the Last Year: Never true  Transportation Needs: No Transportation Needs (12/20/2021)   PRAPARE - Hydrologist (Medical): No    Lack of Transportation (Non-Medical): No  Physical Activity: Sufficiently  Active (12/20/2021)   Exercise Vital Sign    Days of Exercise per Week: 7 days    Minutes of Exercise per Session: 30 min  Stress: No Stress Concern Present (12/20/2021)   [place]    Feeling of Stress : Not at all  Social Connections: Unknown (12/20/2021)   Social Connection and Isolation Panel [NHANES]    Frequency of Communication with Friends and Family: More than three times a week    Frequency of Social Gatherings with Friends and Family: Not on file    Attends Religious Services: Not on file    Active Member of Clubs or Organizations: Not on file    Attends Archivist Meetings: Not on file    Marital Status: Married  Intimate Partner Violence: Not At Risk (12/20/2021)   Humiliation, Afraid, Rape, and Kick questionnaire    Fear of Current or Ex-Partner: No    Emotionally Abused: No    Physically Abused: No    Sexually Abused: No    FAMILY HISTORY: Family History  Problem Relation Age of Onset   Stroke Mother    Heart disease Mother    Heart disease Father    Stroke Father     ALLERGIES:  has No Known Allergies.  MEDICATIONS:  Current Outpatient Medications  Medication Sig Dispense Refill   Ascorbic Acid (VITAMIN C) 1000 MG tablet Take 1,000 mg by mouth daily.     blood glucose meter kit and supplies KIT Dispense based on patient and insurance preference. One Touch Verio Flex Meter  [id number]). Use once daily. Dx E11.9. 1 each 0   glucose blood test strip Use three times daily. One touch verio strips. 300 each 3   levothyroxine (SYNTHROID) 25 MCG tablet TAKE 2&1/2 TABLETS BY MOUTH DAILY WITH BREAKFAST 225 tablet 1   metFORMIN (GLUCOPHAGE) 500 MG tablet Take 2 tablets (1,000 mg total) by mouth 2 (two) times daily with a meal. 360 tablet 1   OneTouch Delica Lancets 56C MISC Used to check blood sugar one time a day. 100 each 1   rosuvastatin (CRESTOR) 20 MG tablet TAKE 1 TABLET BY MOUTH  EVERY DAY 90 tablet 3   vitamin B-12 (CYANOCOBALAMIN) 1000 MCG tablet Take 1,000 mcg by mouth daily.     No current facility-administered medications for this visit.    Review of Systems  Constitutional:  Positive for fatigue and unexpected weight change. Negative for appetite change, chills and fever.  HENT:   Negative for hearing loss and voice change.   Eyes:  Negative for eye problems.  Respiratory:  Negative for chest tightness, cough and shortness of breath.   Cardiovascular:  Negative for chest pain.  Gastrointestinal:  Positive for abdominal pain. Negative for abdominal distention and blood in stool.  Endocrine: Negative for hot flashes.  Genitourinary:  Negative for difficulty urinating and frequency.   Musculoskeletal:  Negative for arthralgias.  Skin:  Negative for itching and  rash.  Neurological:  Negative for extremity weakness.  Hematological:  Negative for adenopathy.  Psychiatric/Behavioral:  Negative for confusion.     PHYSICAL EXAMINATION: ECOG PERFORMANCE STATUS: 1 - Symptomatic but completely ambulatory  Vitals:   03/25/22 1118  BP: (!) 128/95  Pulse: 88  Resp: 18  Temp: 98.3 F (36.8 C)   Filed Weights   03/25/22 1118  Weight: 110 lb 9.6 oz (50.2 kg)    Physical Exam Constitutional:      General: She is not in acute distress. HENT:     Head: Normocephalic and atraumatic.     Nose: Nose normal.     Mouth/Throat:     Pharynx: No oropharyngeal exudate.  Eyes:     General: No scleral icterus.    Pupils: Pupils are equal, round, and reactive to light.  Cardiovascular:     Rate and Rhythm: Normal rate.     Heart sounds: No murmur heard. Pulmonary:     Effort: Pulmonary effort is normal. No respiratory distress.  Abdominal:     General: Bowel sounds are normal. There is no distension.     Palpations: Abdomen is soft.  Musculoskeletal:        General: Normal range of motion.     Cervical back: Normal range of motion and neck supple.  Skin:     General: Skin is warm and dry.  Neurological:     Mental Status: She is alert and oriented to person, place, and time. Mental status is at baseline.     Cranial Nerves: No cranial nerve deficit.     Motor: No abnormal muscle tone.  Psychiatric:        Mood and Affect: Mood and affect normal.      LABORATORY DATA:  I have reviewed the data as listed    Latest Ref Rng & Units 03/21/2022   10:30 AM 10/06/2021    9:39 AM 09/17/2021   10:40 AM  CBC  WBC 4.0 - 10.5 K/uL 8.3  7.0  12.7   Hemoglobin 12.0 - 15.0 g/dL 12.4  14.0  13.9   Hematocrit 36.0 - 46.0 % 36.9  43.0  42.3   Platelets 150.0 - 400.0 K/uL 416.0  201.0  205.0       Latest Ref Rng & Units 03/21/2022    2:15 PM 03/21/2022   10:30 AM 09/17/2021   10:40 AM  CMP  Glucose 70 - 99 mg/dL  154  139   BUN 6 - 23 mg/dL  8  14   Creatinine 0.44 - 1.00 mg/dL 0.40  0.58  0.63   Sodium 135 - 145 mEq/L  139  139   Potassium 3.5 - 5.1 mEq/L  3.3  3.6   Chloride 96 - 112 mEq/L  99  102   CO2 19 - 32 mEq/L  26  26   Calcium 8.4 - 10.5 mg/dL  9.1  9.8   Total Protein 6.0 - 8.3 g/dL  5.9  7.3   Total Bilirubin 0.2 - 1.2 mg/dL  0.4  0.5   Alkaline Phos 39 - 117 U/L  55  46   AST 0 - 37 U/L  24  22   ALT 0 - 35 U/L  15  16     RADIOGRAPHIC STUDIES: I have personally reviewed the radiological images as listed and agreed with the findings in the report. CT Abdomen Pelvis W Contrast  Result Date: 03/21/2022 CLINICAL DATA:  Epigastric abdominal pain for 1 month. EXAM:  CT ABDOMEN AND PELVIS WITH CONTRAST TECHNIQUE: Multidetector CT imaging of the abdomen and pelvis was performed using the standard protocol following bolus administration of intravenous contrast. RADIATION DOSE REDUCTION: This exam was performed according to the departmental dose-optimization program which includes automated exposure control, adjustment of the mA and/or kV according to patient size and/or use of iterative reconstruction technique. CONTRAST:  37m OMNIPAQUE IOHEXOL  300 MG/ML  SOLN COMPARISON:  None Available. FINDINGS: Lower chest: No acute abnormality. Hepatobiliary: No gallstones or biliary dilatation is noted. Small hepatic cysts are noted. Pancreas: Marked atrophy is noted without acute inflammation or ductal dilatation. Spleen: Normal in size without focal abnormality. Adrenals/Urinary Tract: Adrenal glands are unremarkable. Kidneys are normal, without renal calculi, focal lesion, or hydronephrosis. Bladder is unremarkable. Stomach/Bowel: The stomach appears normal. There is no evidence of bowel obstruction or inflammation. The appendix appears normal. Sigmoid diverticulosis is noted without inflammation. Vascular/Lymphatic: Aortic atherosclerosis. No enlarged abdominal or pelvic lymph nodes. Reproductive: Uterus and bilateral adnexa are unremarkable. Other: Minimal ascites is noted. Extensive omental caking is noted in the left upper quadrant consistent with peritoneal carcinomatosis. Small peritoneal implant is noted anteriorly in the upper abdomen. Another probable peritoneal implant is noted in the pelvis adjacent to small bowel loops measuring 3.1 x 2.3 cm. Musculoskeletal: No acute or significant osseous findings. IMPRESSION: Extensive omental caking is noted in left upper quadrant consistent with peritoneal carcinomatosis. Peritoneal implants are also noted anteriorly in the upper abdomen as well as in the pelvis, also consistent with peritoneal carcinomatosis. Minimal ascites is noted. These results will be called to the ordering clinician or representative by the Radiologist Assistant, and communication documented in the PACS or zVision Dashboard. Sigmoid diverticulosis without inflammation. Pancreatic atrophy is noted. Aortic Atherosclerosis (ICD10-I70.0). Electronically Signed   By: JMarijo ConceptionM.D.   On: 03/21/2022 15:03   UK[place]Venous Img Lower Bilateral  Result Date: 03/21/2022 CLINICAL DATA:  86year old female with edema EXAM: BILATERAL LOWER  EXTREMITY VENOUS DOPPLER ULTRASOUND TECHNIQUE: Gray-scale sonography with graded compression, as well as color Doppler and duplex ultrasound were performed to evaluate the lower extremity deep venous systems from the level of the common femoral vein and including the common femoral, femoral, profunda femoral, popliteal and calf veins including the posterior tibial, peroneal and gastrocnemius veins when visible. The superficial great saphenous vein was also interrogated. Spectral Doppler was utilized to evaluate flow at rest and with distal augmentation maneuvers in the common femoral, femoral and popliteal veins. COMPARISON:  None Available. FINDINGS: RIGHT LOWER EXTREMITY Common Femoral Vein: No evidence of thrombus. Normal compressibility, respiratory phasicity and response to augmentation. Saphenofemoral Junction: No evidence of thrombus. Normal compressibility and flow on color Doppler imaging. Profunda Femoral Vein: No evidence of thrombus. Normal compressibility and flow on color Doppler imaging. Femoral Vein: No evidence of thrombus. Normal compressibility, respiratory phasicity and response to augmentation. Popliteal Vein: No evidence of thrombus. Normal compressibility, respiratory phasicity and response to augmentation. Calf Veins: No evidence of thrombus. Normal compressibility and flow on color Doppler imaging. Superficial Great Saphenous Vein: No evidence of thrombus. Normal compressibility and flow on color Doppler imaging. Other Findings: Lentiform fluid in the popliteal region, 3.0 cm x 1.1 cm x 5.5 cm, compatible with Baker's cyst. LEFT LOWER EXTREMITY Common Femoral Vein: No evidence of thrombus. Normal compressibility, respiratory phasicity and response to augmentation. Saphenofemoral Junction: No evidence of thrombus. Normal compressibility and flow on color Doppler imaging. Profunda Femoral Vein: No evidence of thrombus. Normal compressibility and flow on  color Doppler imaging. Femoral Vein: No  evidence of thrombus. Normal compressibility, respiratory phasicity and response to augmentation. Popliteal Vein: No evidence of thrombus. Normal compressibility, respiratory phasicity and response to augmentation. Calf Veins: No evidence of thrombus. Normal compressibility and flow on color Doppler imaging. Superficial Great Saphenous Vein: No evidence of thrombus. Normal compressibility and flow on color Doppler imaging. Other Findings: Lentiform fluid in the popliteal region, 5.6 cm x 1.2 cm x 2.5 cm, compatible with Baker's cyst. IMPRESSION: Directed duplex bilateral lower extremities negative for DVT. Bilateral Baker's cyst Signed, Dulcy Fanny. Nadene Rubins, RPVI [facility] Electronically Signed   By: Corrie Mckusick D.O.   On: 03/21/2022 13:38    ASSESSMENT & PLAN:   Goals of care, counseling/discussion Discussed with patient.   Peritoneal carcinomatosis [facility]) Images were reviewed by me and discussed with patient and daughter.  Metastasis to peritoneal cavity vs primary peritoneal carcinomatosis.   Recommend CT guided omental/peritoneal mass biopsy to establish tissue diagnosis.  Obtain PET scan for staging.  Check CA 27.29, CA 15.3, CEA, LDH, CA 125, PT, PTT   Orders Placed This Encounter  Procedures   NM PET Image Initial (PI) Skull Base To Thigh    Standing Status:   Future    Standing Expiration Date:   03/25/2023    Order Specific Question:   If indicated for the ordered procedure, I authorize the administration of a radiopharmaceutical per Radiology protocol    Answer:   Yes    Order Specific Question:   Preferred imaging location?    Answer:   [facility]   CT BIOPSY    Standing Status:   Future    Standing Expiration Date:   03/25/2023    Order Specific Question:   Reason for exam:    Answer:   peritonial carcinomatosis    Order Specific Question:   Preferred imaging location?    Answer:   [facility]    Lactate dehydrogenase    Standing Status:   Future    Number of Occurrences:   1    Standing Expiration Date:   03/26/2023   Protime-INR    Standing Status:   Future    Number of Occurrences:   1    Standing Expiration Date:   03/26/2023   APTT    Standing Status:   Future    Number of Occurrences:   1    Standing Expiration Date:   03/26/2023   CA 125    Standing Status:   Future    Number of Occurrences:   1    Standing Expiration Date:   03/26/2023   Cancer antigen 27.29    Standing Status:   Future    Number of Occurrences:   1    Standing Expiration Date:   03/26/2023   Cancer antigen 15-3    Standing Status:   Future    Number of Occurrences:   1    Standing Expiration Date:   03/26/2023   CEA    Standing Status:   Future    Number of Occurrences:   1    Standing Expiration Date:   03/25/2023   Follow  up 1 week after biopsy   All questions were answered. The patient knows to call the clinic with any problems, questions or concerns. No barriers to learning was detected.  Earlie Server, MD 03/25/2022

## 2022-03-26 LAB — CEA: CEA: 0.8 ng/mL (ref 0.0–4.7)

## 2022-03-26 LAB — CANCER ANTIGEN 15-3: CA 15-3: 60.1 U/mL — ABNORMAL HIGH (ref 0.0–25.0)

## 2022-03-26 LAB — CANCER ANTIGEN 27.29: CA 27.29: 88.6 U/mL — ABNORMAL HIGH (ref 0.0–38.6)

## 2022-03-28 NOTE — Telephone Encounter (Signed)
Pt informed of biopsy on 9/29 @ 8:30a arrival 7:30a.   Please schedule pt for MD approx 1 week after biospy and inform pt of appt. Thanks

## 2022-03-29 LAB — CA 125: Cancer Antigen (CA) 125: 482 U/mL — ABNORMAL HIGH (ref 0.0–38.1)

## 2022-03-30 NOTE — Progress Notes (Signed)
Patient on schedule for omental biopsy 9/29, called and spoke with patient on phone with pre procedure instructions given. Made aware to be here at 0730, NPO after Mn and driver post procedure/recovery/discharge. Stated understanding. Will hold am Metformin day of procedure.

## 2022-03-31 ENCOUNTER — Other Ambulatory Visit: Payer: Self-pay | Admitting: Internal Medicine

## 2022-03-31 NOTE — H&P (Signed)
Chief Complaint: Patient was seen in consultation today for Omental mass biopsy  Referring Physician(s): Yu,Zhou  Supervising Physician: Juliet Rude  Patient Status: [facility] - Out-pt  History of Present Illness: Erin Cook is a 86 y.o. female with a medical history significant for right breast cancer (1976, s/p mastectomy and axillary LN dissection), DM, and recently identified omental caking consistent with peritoneal carcinomatosis. She was assessed by her PCP 03/21/22 and she reported upper abdominal pain for about a month. She also reported urinary frequency, dark orange urine and bilateral lower extremity swelling. Imaging obtained showed findings concerning for peritoneal carcinomatosis.    CT Abdomen/Pelvis 03/21/22 IMPRESSION: 1. Extensive omental caking is noted in left upper quadrant consistent with peritoneal carcinomatosis. Peritoneal implants are also noted anteriorly in the upper abdomen as well as in the pelvis, also consistent with peritoneal carcinomatosis. Minimal ascites is noted. These results will be called to the ordering clinician or representative by the Radiologist Assistant, and communication documented in the PACS or zVision Dashboard. 2. Sigmoid diverticulosis without inflammation. 3. Pancreatic atrophy is noted. 4. Aortic Atherosclerosis (ICD10-I70.0).  Interventional Radiology has been asked to evaluate this patient for an image-guided omental mass biopsy. Imaging reviewed and procedure approved by Dr. Serafina Royals  Past Medical History:  Diagnosis Date   Arthritis    hands   Breast cancer ([place]) 1976   Car sickness    Diabetes mellitus without complication ([place])    GERD (gastroesophageal reflux disease)    HOH (hard of hearing)    mild   Hyperlipidemia    Hypothyroidism     Past Surgical History:  Procedure Laterality Date   BREAST RECONSTRUCTION Bilateral 1978   CATARACT EXTRACTION W/PHACO Left 12/11/2017   Procedure: CATARACT  EXTRACTION PHACO AND INTRAOCULAR LENS PLACEMENT ([place])  LEFT DIABETIC;  Surgeon: Leandrew Koyanagi, MD;  Location: [place];  Service: Ophthalmology;  Laterality: Left;  use left arm for BPs and IVs   CYST EXCISION  1980   In her chest.    MASTECTOMY Bilateral 1978   ROTATOR CUFF REPAIR Right July, 2015, Jan, 2016   Dr. Lisette Grinder.  Dr.  Amedeo Plenty.    THYROIDECTOMY  1980   TONSILLECTOMY  1945    Allergies: Patient has no known allergies.  Medications: Prior to Admission medications   Medication Sig Start Date End Date Taking? Authorizing Provider  Ascorbic Acid (VITAMIN C) 1000 MG tablet Take 1,000 mg by mouth daily.    [provider]  blood glucose meter kit and supplies KIT Dispense based on patient and insurance preference. One Touch Verio Flex Meter  [phone]). Use once daily. Dx E11.9. 09/20/21   Leone Haven, MD  glucose blood test strip Use three times daily. One touch verio strips. 10/01/21   Leone Haven, MD  levothyroxine (SYNTHROID) 25 MCG tablet TAKE 2&1/2 TABLETS BY MOUTH DAILY WITH BREAKFAST 10/11/21   Leone Haven, MD  metFORMIN (GLUCOPHAGE) 500 MG tablet Take 2 tablets (1,000 mg total) by mouth 2 (two) times daily with a meal. 03/18/21   Leone Haven, MD  OneTouch Delica Lancets 56P MISC Used to check blood sugar one time a day. 04/20/21   Leone Haven, MD  rosuvastatin (CRESTOR) 20 MG tablet TAKE 1 TABLET BY MOUTH EVERY DAY 01/12/21   Leone Haven, MD  vitamin B-12 (CYANOCOBALAMIN) 1000 MCG tablet Take 1,000 mcg by mouth daily.    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 Has one daughter, who lives in [place].      Work - Worked for [employer]. Retired in 1996.      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, occupational at [employer] for 20 years   Social Determinants of [employer]: Low Risk  (12/20/2021)   Overall Financial Resource Strain (CARDIA)    Difficulty of Paying Living Expenses: Not hard at all  Food Insecurity: No Food Insecurity (12/20/2021)   Hunger Vital Sign    Worried About Running Out of Food in the Last Year: Never true    [place] in the Last Year: Never true  Transportation Needs: No Transportation Needs (12/20/2021)   PRAPARE - Hydrologist (Medical): No    Lack of Transportation (Non-Medical): No  Physical Activity: Sufficiently Active (12/20/2021)   Exercise Vital Sign    Days of Exercise per Week: 7 days    Minutes of Exercise per Session: 30 min  Stress: No Stress Concern Present (12/20/2021)   [place]    Feeling of Stress : Not at all  Social Connections: Unknown (12/20/2021)   Social Connection and Isolation Panel [NHANES]    Frequency of Communication with Friends and Family: More than three times a week    Frequency of Social Gatherings with Friends and Family: Not on file    Attends Religious Services: Not on file    Active Member of Clubs or Organizations: Not on file    Attends Archivist Meetings: Not on file    Marital Status: Married    Review of Systems: A 12 point ROS discussed and pertinent positives are indicated in the HPI  above.  All other systems are negative.  Review of Systems  Constitutional:  Negative for appetite change and fatigue.  Respiratory:  Negative for cough and shortness of breath.   Cardiovascular:  Negative for chest pain and leg swelling.  Gastrointestinal:  Negative for abdominal pain, diarrhea, nausea and vomiting.  Neurological:  Negative for dizziness and headaches.    Vital Signs: BP 126/66   Pulse 84   Temp 98.7 F (37.1 C) (Oral)   Resp 20   Ht _0  (1.6 m)   Wt 114 lb (51.7 kg)   SpO2 100%   BMI 20.19 kg/m   Physical Exam Constitutional:      General: She is not in acute distress.    Appearance: She is not ill-appearing.  HENT:     Mouth/Throat:  Mouth: Mucous membranes are moist.     Pharynx: Oropharynx is clear.  Cardiovascular:     Rate and Rhythm: Normal rate and regular rhythm.     Pulses: Normal pulses.     Heart sounds: Normal heart sounds.  Pulmonary:     Effort: Pulmonary effort is normal.     Breath sounds: Normal breath sounds.  Abdominal:     General: Bowel sounds are normal.     Palpations: Abdomen is soft.  Skin:    General: Skin is warm and dry.  Neurological:     Mental Status: She is alert and oriented to person, place, and time.     Imaging: CT Abdomen Pelvis W Contrast  Result Date: 03/21/2022 CLINICAL DATA:  Epigastric abdominal pain for 1 month. EXAM: CT ABDOMEN AND PELVIS WITH CONTRAST TECHNIQUE: Multidetector CT imaging of the abdomen and pelvis was performed using the standard protocol following bolus administration of intravenous contrast. RADIATION DOSE REDUCTION: This exam was performed according to the departmental dose-optimization program which includes automated exposure control, adjustment of the mA and/or kV according to patient size and/or use of iterative reconstruction technique. CONTRAST:  80m OMNIPAQUE IOHEXOL 300 MG/ML  SOLN COMPARISON:  None Available. FINDINGS: Lower chest: No acute abnormality. Hepatobiliary: No  gallstones or biliary dilatation is noted. Small hepatic cysts are noted. Pancreas: Marked atrophy is noted without acute inflammation or ductal dilatation. Spleen: Normal in size without focal abnormality. Adrenals/Urinary Tract: Adrenal glands are unremarkable. Kidneys are normal, without renal calculi, focal lesion, or hydronephrosis. Bladder is unremarkable. Stomach/Bowel: The stomach appears normal. There is no evidence of bowel obstruction or inflammation. The appendix appears normal. Sigmoid diverticulosis is noted without inflammation. Vascular/Lymphatic: Aortic atherosclerosis. No enlarged abdominal or pelvic lymph nodes. Reproductive: Uterus and bilateral adnexa are unremarkable. Other: Minimal ascites is noted. Extensive omental caking is noted in the left upper quadrant consistent with peritoneal carcinomatosis. Small peritoneal implant is noted anteriorly in the upper abdomen. Another probable peritoneal implant is noted in the pelvis adjacent to small bowel loops measuring 3.1 x 2.3 cm. Musculoskeletal: No acute or significant osseous findings. IMPRESSION: Extensive omental caking is noted in left upper quadrant consistent with peritoneal carcinomatosis. Peritoneal implants are also noted anteriorly in the upper abdomen as well as in the pelvis, also consistent with peritoneal carcinomatosis. Minimal ascites is noted. These results will be called to the ordering clinician or representative by the Radiologist Assistant, and communication documented in the PACS or zVision Dashboard. Sigmoid diverticulosis without inflammation. Pancreatic atrophy is noted. Aortic Atherosclerosis (ICD10-I70.0). Electronically Signed   By: JMarijo ConceptionM.D.   On: 03/21/2022 15:03   UK[place]Venous Img Lower Bilateral  Result Date: 03/21/2022 CLINICAL DATA:  86year old female with edema EXAM: BILATERAL LOWER EXTREMITY VENOUS DOPPLER ULTRASOUND TECHNIQUE: Gray-scale sonography with graded compression, as well as color  Doppler and duplex ultrasound were performed to evaluate the lower extremity deep venous systems from the level of the common femoral vein and including the common femoral, femoral, profunda femoral, popliteal and calf veins including the posterior tibial, peroneal and gastrocnemius veins when visible. The superficial great saphenous vein was also interrogated. Spectral Doppler was utilized to evaluate flow at rest and with distal augmentation maneuvers in the common femoral, femoral and popliteal veins. COMPARISON:  None Available. FINDINGS: RIGHT LOWER EXTREMITY Common Femoral Vein: No evidence of thrombus. Normal compressibility, respiratory phasicity and response to augmentation. Saphenofemoral Junction: No evidence of thrombus. Normal compressibility and flow on color Doppler imaging. Profunda Femoral  Vein: No evidence of thrombus. Normal compressibility and flow on color Doppler imaging. Femoral Vein: No evidence of thrombus. Normal compressibility, respiratory phasicity and response to augmentation. Popliteal Vein: No evidence of thrombus. Normal compressibility, respiratory phasicity and response to augmentation. Calf Veins: No evidence of thrombus. Normal compressibility and flow on color Doppler imaging. Superficial Great Saphenous Vein: No evidence of thrombus. Normal compressibility and flow on color Doppler imaging. Other Findings: Lentiform fluid in the popliteal region, 3.0 cm x 1.1 cm x 5.5 cm, compatible with Baker's cyst. LEFT LOWER EXTREMITY Common Femoral Vein: No evidence of thrombus. Normal compressibility, respiratory phasicity and response to augmentation. Saphenofemoral Junction: No evidence of thrombus. Normal compressibility and flow on color Doppler imaging. Profunda Femoral Vein: No evidence of thrombus. Normal compressibility and flow on color Doppler imaging. Femoral Vein: No evidence of thrombus. Normal compressibility, respiratory phasicity and response to augmentation. Popliteal  Vein: No evidence of thrombus. Normal compressibility, respiratory phasicity and response to augmentation. Calf Veins: No evidence of thrombus. Normal compressibility and flow on color Doppler imaging. Superficial Great Saphenous Vein: No evidence of thrombus. Normal compressibility and flow on color Doppler imaging. Other Findings: Lentiform fluid in the popliteal region, 5.6 cm x 1.2 cm x 2.5 cm, compatible with Baker's cyst. IMPRESSION: Directed duplex bilateral lower extremities negative for DVT. Bilateral Baker's cyst Signed, Dulcy Fanny. Nadene Rubins, RPVI [facility] Electronically Signed   By: Corrie Mckusick D.O.   On: 03/21/2022 13:38    Labs:  CBC: Recent Labs    09/17/21 1040 10/06/21 0939 03/21/22 1030 04/01/22 0743  WBC 12.7* 7.0 8.3 14.7*  HGB 13.9 14.0 12.4 11.4*  HCT 42.3 43.0 36.9 35.5*  PLT 205.0 201.0 416.0* 411*    COAGS: Recent Labs    03/25/22 1207 04/01/22 0743  INR 1.1 1.1  APTT 29  --     BMP: Recent Labs    09/17/21 1040 03/21/22 1030 03/21/22 1415  NA 139 139  --   K 3.6 3.3*  --   CL 102 99  --   CO2 26 26  --   GLUCOSE 139* 154*  --   BUN 14 8  --   CALCIUM 9.8 9.1  --   CREATININE 0.63 0.58 0.40*    LIVER FUNCTION TESTS: Recent Labs    09/17/21 1040 03/21/22 1030  BILITOT 0.5 0.4  AST 22 24  ALT 16 15  ALKPHOS 46 55  PROT 7.3 5.9*  ALBUMIN 4.8 3.6    TUMOR MARKERS: No results for input(s): "AFPTM", "CEA", "CA199", "CHROMGRNA" in the last 8760 hours.  Assessment and Plan:  Omental caking concerning for peritoneal carcinomatosis: Otilio Connors, 86 year old female, presents today to the [facility] Interventional Radiology department for an image-guided omental mass biopsy.  Risks and benefits of this procedure were discussed with the patient and/or patient's family including, but not limited to bleeding, infection, damage to adjacent structures  or low yield requiring additional tests.  All of the questions were answered and there is agreement to proceed. She has been NPO. She does not take any blood-thinning medications.   Consent signed and in chart.  Thank you for this interesting consult.  I greatly enjoyed meeting BAILEA BEED and look forward to participating in their care.  A copy of this report was sent to the requesting provider on this date.  Electronically Signed: Soyla Dryer, AGACNP-BC [phone] 04/01/2022, 8:32 AM   I spent a total  of  30 Minutes   in face to face in clinical consultation, greater than 50% of which was counseling/coordinating care for omental mass biopsy.

## 2022-04-01 ENCOUNTER — Ambulatory Visit: Payer: No Typology Code available for payment source

## 2022-04-01 ENCOUNTER — Other Ambulatory Visit: Payer: Self-pay | Admitting: Interventional Radiology

## 2022-04-01 ENCOUNTER — Other Ambulatory Visit: Payer: Self-pay

## 2022-04-01 ENCOUNTER — Ambulatory Visit
Admission: RE | Admit: 2022-04-01 | Discharge: 2022-04-01 | Disposition: A | Discharge status: Home / Self Care | Payer: No Typology Code available for payment source | Source: Ambulatory Visit | Attending: Oncology | Admitting: Oncology

## 2022-04-01 VITALS — BP 126/58 | HR 72 | Temp 98.7°F | Resp 20 | Ht 63.0 in | Wt 114.0 lb

## 2022-04-01 DIAGNOSIS — C786 Secondary malignant neoplasm of retroperitoneum and peritoneum: Secondary | ICD-10-CM

## 2022-04-01 DIAGNOSIS — R220 Localized swelling, mass and lump, head: Secondary | ICD-10-CM | POA: Insufficient documentation

## 2022-04-01 DIAGNOSIS — R188 Other ascites: Secondary | ICD-10-CM | POA: Diagnosis not present

## 2022-04-01 DIAGNOSIS — K668 Other specified disorders of peritoneum: Secondary | ICD-10-CM | POA: Diagnosis not present

## 2022-04-01 DIAGNOSIS — Z9011 Acquired absence of right breast and nipple: Secondary | ICD-10-CM | POA: Insufficient documentation

## 2022-04-01 DIAGNOSIS — E119 Type 2 diabetes mellitus without complications: Secondary | ICD-10-CM | POA: Insufficient documentation

## 2022-04-01 DIAGNOSIS — M7989 Other specified soft tissue disorders: Secondary | ICD-10-CM | POA: Diagnosis not present

## 2022-04-01 DIAGNOSIS — Z853 Personal history of malignant neoplasm of breast: Secondary | ICD-10-CM | POA: Diagnosis not present

## 2022-04-01 DIAGNOSIS — R350 Frequency of micturition: Secondary | ICD-10-CM | POA: Diagnosis not present

## 2022-04-01 LAB — CBC
HCT: 35.5 % — ABNORMAL LOW (ref 36.0–46.0)
Hemoglobin: 11.4 g/dL — ABNORMAL LOW (ref 12.0–15.0)
MCH: 31.5 pg (ref 26.0–34.0)
MCHC: 32.1 g/dL (ref 30.0–36.0)
MCV: 98.1 fL (ref 80.0–100.0)
Platelets: 411 10*3/uL — ABNORMAL HIGH (ref 150–400)
RBC: 3.62 MIL/uL — ABNORMAL LOW (ref 3.87–5.11)
RDW: 13 % (ref 11.5–15.5)
WBC: 14.7 10*3/uL — ABNORMAL HIGH (ref 4.0–10.5)
nRBC: 0 % (ref 0.0–0.2)

## 2022-04-01 LAB — PROTIME-INR
INR: 1.1 (ref 0.8–1.2)
Prothrombin Time: 14.1 seconds (ref 11.4–15.2)

## 2022-04-01 LAB — GLUCOSE, CAPILLARY
Glucose-Capillary: 143 mg/dL — ABNORMAL HIGH (ref 70–99)
Glucose-Capillary: 134 mg/dL — ABNORMAL HIGH (ref 70–99)

## 2022-04-01 MED ORDER — MIDAZOLAM HCL 2 MG/2ML IJ SOLN
INTRAMUSCULAR | Status: AC
  Filled 2022-04-01: qty 2

## 2022-04-01 MED ORDER — FENTANYL CITRATE (PF) 100 MCG/2ML IJ SOLN
INTRAMUSCULAR | Status: AC | PRN
  Administered 2022-04-01 (×3): 25 ug via INTRAVENOUS

## 2022-04-01 MED ORDER — MIDAZOLAM HCL 2 MG/2ML IJ SOLN
INTRAMUSCULAR | Status: AC | PRN
  Administered 2022-04-01 (×2): .5 mg via INTRAVENOUS

## 2022-04-01 MED ORDER — FENTANYL CITRATE (PF) 100 MCG/2ML IJ SOLN
INTRAMUSCULAR | Status: AC
  Filled 2022-04-01: qty 2

## 2022-04-01 NOTE — H&P (Deleted)
H&P Update    Patient has H&P documented in the EMR within the past 30 days. See H&P dated 09/22 by Dr. Tasia Catchings.    Patient reports no interval changes to their medical history or medications.    Physical Exam:  Temp: 98.7 F (37.1 C) (Temp Source: Oral)  Pulse Rate: 84  Resp: 20  BP: 126/66  SpO2: 100 %  Height: '5\' 3"'$  (160 cm)  Weight: 51.7 kg  Body mass index is 20.19 kg/m.  Mallampati score: I (soft palate, uvula, fauces, and tonsillar pillars visible) CV: Regular rate.  Pulmonary: Normal work of breathing. On room air.      Plan:  CT guided omental core needle biopsy, possible paracentesis  Consent obtained: Risks of the procedure as well as the alternatives and risks of each were explained to the patient and/or caregiver.  Consent for the procedure was obtained and is signed in the bedside chart Consent obtained from: The patient Patient is appropriate candidate for sedation Yes Sedation plan: fentanyl and midazolam ASA Classification: ASA 2 - Patient with mild systemic disease with no functional limitations NPO status: 0000  Code status:   Code Status: Not on file Pre-procedural prep necessary: none       Albin Felling, MD  Vascular and Interventional Radiology 04/01/2022 8:21 AM

## 2022-04-01 NOTE — Procedures (Signed)
Interventional Radiology Procedure Note  Date of Procedure: 04/01/2022  Procedure: CT Paracentesis, CT vore biopsy of omental mass   Findings:  1. CT paracentesis with 400 amber fluid removed  2. CT core biopsy of omental mass, 18ga x4 passes    Complications: No immediate complications noted.   Estimated Blood Loss: minimal  Follow-up and Recommendations: 1. Bedrest 1 hour    Albin Felling, MD  Vascular & Interventional Radiology  04/01/2022 9:24 AM

## 2022-04-04 ENCOUNTER — Other Ambulatory Visit: Payer: Self-pay

## 2022-04-04 ENCOUNTER — Encounter: Payer: Self-pay | Admitting: Family Medicine

## 2022-04-04 ENCOUNTER — Ambulatory Visit (INDEPENDENT_AMBULATORY_CARE_PROVIDER_SITE_OTHER): Payer: No Typology Code available for payment source | Admitting: Family Medicine

## 2022-04-04 DIAGNOSIS — R600 Localized edema: Secondary | ICD-10-CM

## 2022-04-04 DIAGNOSIS — C786 Secondary malignant neoplasm of retroperitoneum and peritoneum: Secondary | ICD-10-CM

## 2022-04-04 DIAGNOSIS — E039 Hypothyroidism, unspecified: Secondary | ICD-10-CM | POA: Diagnosis not present

## 2022-04-04 DIAGNOSIS — E119 Type 2 diabetes mellitus without complications: Secondary | ICD-10-CM

## 2022-04-04 MED ORDER — METFORMIN HCL 500 MG PO TABS: 1000.0000 mg | ORAL_TABLET | Freq: Two times a day (BID) | ORAL | 3 refills | Status: DC

## 2022-04-04 MED ORDER — LEVOTHYROXINE SODIUM 25 MCG PO TABS: ORAL_TABLET | ORAL | 1 refills | Status: DC

## 2022-04-04 MED ORDER — ROSUVASTATIN CALCIUM 20 MG PO TABS: 20.0000 mg | ORAL_TABLET | Freq: Every day | ORAL | 3 refills | Status: DC

## 2022-04-04 NOTE — Assessment & Plan Note (Addendum)
Awaiting tissue diagnosis.  She will continue to follow with oncology.  Night sweats are likely related to this.  I will send a message to the interventional radiologist just to make sure that it is okay for the patient take a shower after biopsy.

## 2022-04-04 NOTE — Assessment & Plan Note (Signed)
Likely related to what ever cancer is in her abdomen.  Discussed that there is no definitive diagnosis of her cancer at this time though the biopsy result should return in the next week or so and she can discuss further treatment options with oncology.

## 2022-04-04 NOTE — Progress Notes (Signed)
Virtual Visit via telephone Note  This visit type was conducted due to national recommendations for restrictions regarding the COVID-19 pandemic (e.g. social distancing).  This format is felt to be most appropriate for this patient at this time.  All issues noted in this document were discussed and addressed.  No physical exam was performed (except for noted visual exam findings with Video Visits).   I connected with Erin Cook today at  4:30 PM EDT by telephone and verified that I am speaking with the correct person using two identifiers. Location patient: home Location provider: work  Persons participating in the virtual visit: patient, provider, Blen Ransome (husband)  I discussed the limitations, risks, security and privacy concerns of performing an evaluation and management service by telephone and the availability of in person appointments. I also discussed with the patient that there may be a patient responsible charge related to this service. The patient expressed understanding and agreed to proceed.  Interactive audio and video telecommunications were attempted between this provider and patient, however failed, due to patient having technical difficulties OR patient did not have access to video capability.  We continued and completed visit with audio only.   Reason for visit: f/u.  HPI: Peritoneal carcinomatosis: Patient notes she had some questions that she wanted to discuss.  She has been having some soaking night sweats for the last couple of weeks and she wondered what that was related to.  She had a CT biopsy notes that went well.  She had some leakage from the biopsy site though it stopped the next morning.  She notes there is something in the paperwork about not taking a bath or getting in a hot tub or pool though she wondered if she could take a shower.  She notes the area has healed up well.  Her leg swelling continues to be there and does intermittently improve overnight.   She has a PET scan and follow-up with oncology next week.  Her biopsy results have not returned.   ROS: See pertinent positives and negatives per HPI.  Past Medical History:  Diagnosis Date   Arthritis    hands   Breast cancer ([place]) 1976   Car sickness    Diabetes mellitus without complication ([place])    GERD (gastroesophageal reflux disease)    HOH (hard of hearing)    mild   Hyperlipidemia    Hypothyroidism     Past Surgical History:  Procedure Laterality Date   BREAST RECONSTRUCTION Bilateral 1978   CATARACT EXTRACTION W/PHACO Left 12/11/2017   Procedure: CATARACT EXTRACTION PHACO AND INTRAOCULAR LENS PLACEMENT ([place])  LEFT DIABETIC;  Surgeon: Leandrew Koyanagi, MD;  Location: [place];  Service: Ophthalmology;  Laterality: Left;  use left arm for BPs and IVs   CYST EXCISION  1980   In her chest.    MASTECTOMY Bilateral 1978   ROTATOR CUFF REPAIR Right July, 2015, Jan, 2016   Dr. Lisette Grinder.  Dr.  Amedeo Plenty.    THYROIDECTOMY  1980   TONSILLECTOMY  1945    Family History  Problem Relation Age of Onset   Stroke Mother    Heart disease Mother    Heart disease Father    Stroke Father     SOCIAL HX: Non-smoker   Current Outpatient Medications:    Ascorbic Acid (VITAMIN C) 1000 MG tablet, Take 1,000 mg by mouth daily., Disp: , Rfl:    blood glucose meter kit and supplies KIT, Dispense based on patient and insurance preference.  One Touch Verio Flex Meter  [phone]). Use once daily. Dx E11.9., Disp: 1 each, Rfl: 0   glucose blood test strip, Use three times daily. One touch verio strips., Disp: 300 each, Rfl: 3   metFORMIN (GLUCOPHAGE) 500 MG tablet, Take 2 tablets (1,000 mg total) by mouth 2 (two) times daily with a meal., Disp: 360 tablet, Rfl: 3   OneTouch Delica Lancets 41J MISC, Used to check blood sugar one time a day., Disp: 100 each, Rfl: 1   vitamin B-12 (CYANOCOBALAMIN) 1000 MCG tablet, Take 1,000 mcg by mouth daily., Disp: , Rfl:     levothyroxine (SYNTHROID) 25 MCG tablet, TAKE 2&1/2 TABLETS BY MOUTH DAILY WITH BREAKFAST, Disp: 225 tablet, Rfl: 1   rosuvastatin (CRESTOR) 20 MG tablet, Take 1 tablet (20 mg total) by mouth daily., Disp: 90 tablet, Rfl: 3  EXAM: This was a telephone visit and thus no exam was completed.  ASSESSMENT AND PLAN:  Discussed the following assessment and plan:  Problem List Items Addressed This Visit     Peritoneal carcinomatosis ([place]) (Chronic)    Awaiting tissue diagnosis.  She will continue to follow with oncology.  Night sweats are likely related to this.  I will send a message to the interventional radiologist just to make sure that it is okay for the patient take a shower after biopsy.      Bilateral lower extremity edema    Likely related to what ever cancer is in her abdomen.  Discussed that there is no definitive diagnosis of her cancer at this time though the biopsy result should return in the next week or so and she can discuss further treatment options with oncology.      Other Visit Diagnoses     Hypothyroidism, unspecified type       Relevant Medications   levothyroxine (SYNTHROID) 25 MCG tablet       Return in about 3 months (around 07/05/2022).   I discussed the assessment and treatment plan with the patient. The patient was provided an opportunity to ask questions and all were answered. The patient agreed with the plan and demonstrated an understanding of the instructions.   The patient was advised to call back or seek an in-person evaluation if the symptoms worsen or if the condition fails to improve as anticipated.  I provided 13 minutes of non-face-to-face time during this encounter.   [name], MD

## 2022-04-05 ENCOUNTER — Telehealth: Payer: Self-pay | Admitting: Family Medicine

## 2022-04-05 LAB — CYTOLOGY - NON PAP

## 2022-04-05 LAB — SURGICAL PATHOLOGY

## 2022-04-05 NOTE — Telephone Encounter (Signed)
Please let the patient know I heard back from the radiologist that did her biopsy.  She can go ahead and start taking showers.  She can let the water run over the biopsy site.  She should not submerge herself in water (such as a bath, pool, or hot tub) for at least 7 days after the biopsy.

## 2022-04-05 NOTE — Telephone Encounter (Signed)
LVM for patient to call back.   [place]i Coupland,cma  

## 2022-04-06 ENCOUNTER — Telehealth: Payer: Self-pay

## 2022-04-06 DIAGNOSIS — C786 Secondary malignant neoplasm of retroperitoneum and peritoneum: Secondary | ICD-10-CM

## 2022-04-06 NOTE — Telephone Encounter (Signed)
I called and spoke with the patient and informed her that she could take showers to let the water run over the biopsy site but not to submerge in a pool, tub or hot tub and she understood these instructions.  [place],cma

## 2022-04-06 NOTE — Telephone Encounter (Signed)
-----   Message from Earlie Server, MD sent at 04/05/2022  9:55 PM EDT ----- Path is back, please see if she would like to discuss plan this week. PET is pending Refer to [facility]

## 2022-04-06 NOTE — Telephone Encounter (Signed)
Spoke to pt and she is ok with coming in tomorrow for appt. Referral to Gyn onc entered.  Please add pt to Dr. Tasia Catchings schedule tomorrow (10/5) @ 3p.  Please schedule pt with Gyn onc, she will get appt tomorrow.

## 2022-04-07 ENCOUNTER — Ambulatory Visit: Payer: No Typology Code available for payment source | Admitting: Oncology

## 2022-04-07 ENCOUNTER — Inpatient Hospital Stay: Payer: No Typology Code available for payment source | Attending: Oncology | Admitting: Oncology

## 2022-04-07 ENCOUNTER — Encounter: Payer: Self-pay | Admitting: Oncology

## 2022-04-07 VITALS — BP 137/62 | HR 77 | Temp 98.1°F | Resp 18 | Wt 110.0 lb

## 2022-04-07 DIAGNOSIS — Z79890 Hormone replacement therapy (postmenopausal): Secondary | ICD-10-CM | POA: Insufficient documentation

## 2022-04-07 DIAGNOSIS — E039 Hypothyroidism, unspecified: Secondary | ICD-10-CM | POA: Diagnosis not present

## 2022-04-07 DIAGNOSIS — R180 Malignant ascites: Secondary | ICD-10-CM | POA: Diagnosis not present

## 2022-04-07 DIAGNOSIS — C482 Malignant neoplasm of peritoneum, unspecified: Secondary | ICD-10-CM | POA: Insufficient documentation

## 2022-04-07 DIAGNOSIS — Z9013 Acquired absence of bilateral breasts and nipples: Secondary | ICD-10-CM | POA: Insufficient documentation

## 2022-04-07 DIAGNOSIS — C786 Secondary malignant neoplasm of retroperitoneum and peritoneum: Secondary | ICD-10-CM | POA: Insufficient documentation

## 2022-04-07 DIAGNOSIS — C801 Malignant (primary) neoplasm, unspecified: Secondary | ICD-10-CM | POA: Diagnosis not present

## 2022-04-07 DIAGNOSIS — Z7189 Other specified counseling: Secondary | ICD-10-CM | POA: Diagnosis not present

## 2022-04-07 DIAGNOSIS — E119 Type 2 diabetes mellitus without complications: Secondary | ICD-10-CM | POA: Insufficient documentation

## 2022-04-07 DIAGNOSIS — M1990 Unspecified osteoarthritis, unspecified site: Secondary | ICD-10-CM | POA: Diagnosis not present

## 2022-04-07 DIAGNOSIS — R600 Localized edema: Secondary | ICD-10-CM | POA: Insufficient documentation

## 2022-04-07 DIAGNOSIS — Z79899 Other long term (current) drug therapy: Secondary | ICD-10-CM | POA: Insufficient documentation

## 2022-04-07 DIAGNOSIS — R978 Other abnormal tumor markers: Secondary | ICD-10-CM | POA: Insufficient documentation

## 2022-04-07 DIAGNOSIS — Z853 Personal history of malignant neoplasm of breast: Secondary | ICD-10-CM | POA: Insufficient documentation

## 2022-04-07 DIAGNOSIS — E7800 Pure hypercholesterolemia, unspecified: Secondary | ICD-10-CM | POA: Diagnosis not present

## 2022-04-07 DIAGNOSIS — Z7984 Long term (current) use of oral hypoglycemic drugs: Secondary | ICD-10-CM | POA: Diagnosis not present

## 2022-04-07 DIAGNOSIS — R971 Elevated cancer antigen 125 [CA 125]: Secondary | ICD-10-CM | POA: Diagnosis not present

## 2022-04-07 NOTE — Progress Notes (Signed)
Hematology/Oncology Consult Note [phone] [phone]     ASSESSMENT & PLAN:   Peritoneal carcinomatosis [facility]) Images were reviewed by me and discussed with patient and daughter.  primary peritoneal carcinomatosis, CT did not reveal any Uterus and bilateral adnexa lesions.  CA125 elevated at 482. Elevated CA 27.29, CA15.3  PET is pending.  Biopsy results was reviewed and discussed with patient.  Refer to [employer].  Recommend Genetic testing/HRD Recommend chemotherapy with carboplatin and Taxol  The diagnosis and care plan were discussed with patient in detail.The goal of treatment which is to palliate disease, disease related symptoms, improve quality of life and hopefully prolong life was highlighted in our discussion.  Chemotherapy education was provided.  We had discussed the composition of chemotherapy regimen, length of chemo cycle, duration of treatment and the time to assess response to treatment.    I explained to the patient the risks and benefits of chemotherapy carboplatin and Taxol including all but not limited to hair loss, mouth sore, nausea, vomiting, diarrhea, low blood counts, bleeding, neuropathy and risk of life threatening infection and even death, secondary malignancy etc.  . Patient and daughter are undecided and will discuss with family members. Pending PET and Gynonc evaluation.   # Chemotherapy education;  Antiemetics-Zofran and Compazine; EMLA cream will be sent to pharmacy    Goals of care, counseling/discussion Discussed with patient and family   Orders Placed This Encounter  Procedures   Ambulatory referral to Genetics    Referral Priority:   Routine    Referral Type:   Consultation    Referral Reason:   Specialty Services Required    Number of Visits Requested:   1   Follow up TBD, pending PET scan and patient's decision.  All questions were answered. The patient knows to call the clinic with any problems, questions or  concerns.  Earlie Server, MD, PhD [facility] Hematology Oncology 04/07/2022    CHIEF COMPLAINTS/PURPOSE OF CONSULTATION:  Peritoneal carcinomatosis  HISTORY OF PRESENTING ILLNESS:  Erin Cook 86 y.o. female presents to establish care for peritoneal carcinomatosis.  I have reviewed her chart and materials related to her cancer extensively and collaborated history with the patient. Summary of oncologic history is as follows: Oncology History  Peritoneal carcinomatosis ([place])  03/21/2022 Imaging   CT abdomen pelvis w contrast Extensive omental caking is noted in left upper quadrant consistent with peritoneal carcinomatosis. Peritoneal implants are also noted anteriorly in the upper abdomen as well as in the pelvis, also consistent with peritoneal carcinomatosis. Minimal ascites is noted. Sigmoid diverticulosis without inflammation. Pancreatic atrophy is noted.   03/21/2022 Initial Diagnosis   Peritoneal carcinomatosis- CA125 482  -Patient has noticed unintentional weight loss 10 to 15 pounds in the past 2 months, decreased appetite and oral intake.  Intermittent diarrhea and abdominal discomfort.  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     History of right breast cancer in 1976, s/p mastectomy and axillary lymph node dissection. She did not take any chemotherapy. She did not recall if she needed to take any endocrine therapy.   INTERVAL HISTORY Erin Cook is a 86 y.o. female who has above history reviewed by me today presents for follow up visit for primary peritoneal carcinomatosis  During the interval, she has had biopsy of omentum. Present to discuss results. Accompanied by daughter.  No new complaints.   MEDICAL HISTORY:  Past Medical History:  Diagnosis Date  Arthritis    hands   Breast cancer ([place]) 1976   Car sickness    Diabetes mellitus without complication ([place])     GERD (gastroesophageal reflux disease)    HOH (hard of hearing)    mild   Hyperlipidemia    Hypothyroidism     SURGICAL HISTORY: Past Surgical History:  Procedure Laterality Date   BREAST RECONSTRUCTION Bilateral 1978   CATARACT EXTRACTION W/PHACO Left 12/11/2017   Procedure: CATARACT EXTRACTION PHACO AND INTRAOCULAR LENS PLACEMENT ([place])  LEFT DIABETIC;  Surgeon: Leandrew Koyanagi, MD;  Location: [place];  Service: Ophthalmology;  Laterality: Left;  use left arm for BPs and IVs   CYST EXCISION  1980   In her chest.    MASTECTOMY Bilateral 1978   ROTATOR CUFF REPAIR Right July, 2015, Jan, 2016   Dr. Lisette Grinder.  Dr.  Amedeo Plenty.    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 Has one daughter, who lives in [place].      Work - Worked for [employer]. Retired in 1996.      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, occupational at [employer] for 20 years   Social Determinants of [employer]: Low Risk  (12/20/2021)   Overall Financial Resource Strain (CARDIA)    Difficulty of Paying Living Expenses: Not hard at all  Food Insecurity: No Food Insecurity (12/20/2021)   Hunger Vital Sign    Worried About Running Out of Food in the Last Year: Never true    [place] in the Last Year: Never true  Transportation Needs: No Transportation Needs (12/20/2021)   PRAPARE -  Hydrologist (Medical): No    Lack of Transportation (Non-Medical): No  Physical Activity: Sufficiently Active (12/20/2021)   Exercise Vital Sign    Days of Exercise per Week: 7 days    Minutes of Exercise per Session: 30 min  Stress: No Stress Concern Present (12/20/2021)   [place]    Feeling of Stress : Not at all  Social Connections: Unknown (12/20/2021)   Social Connection and Isolation Panel [NHANES]    Frequency of Communication with Friends and Family: More than three times a week    Frequency of Social Gatherings with Friends and Family: Not on file    Attends Religious Services: Not on file    Active Member of Clubs or Organizations: Not on file    Attends Archivist Meetings: Not on file    Marital Status: Married  Intimate Partner Violence: Not At Risk (12/20/2021)   Humiliation, Afraid, Rape, and Kick questionnaire    Fear of Current or  Ex-Partner: No    Emotionally Abused: No    Physically Abused: No    Sexually Abused: No    FAMILY HISTORY: Family History  Problem Relation Age of Onset   Stroke Mother    Heart disease Mother    Heart disease Father    Stroke Father     ALLERGIES:  has No Known Allergies.  MEDICATIONS:  Current Outpatient Medications  Medication Sig Dispense Refill   Ascorbic Acid (VITAMIN C) 1000 MG tablet Take 1,000 mg by mouth daily.     blood glucose meter kit and supplies KIT Dispense based on patient and insurance preference. One Touch Verio Flex Meter  [id number]). Use once daily. Dx E11.9. 1 each 0   glucose blood test strip Use three times daily. One touch verio strips. 300 each 3   levothyroxine (SYNTHROID) 25 MCG tablet TAKE 2&1/2 TABLETS BY MOUTH DAILY WITH BREAKFAST 225 tablet 1   metFORMIN (GLUCOPHAGE) 500 MG tablet Take 2 tablets (1,000 mg total) by mouth 2 (two) times daily with a meal. 360 tablet 3   OneTouch Delica  Lancets 73X MISC Used to check blood sugar one time a day. 100 each 1   rosuvastatin (CRESTOR) 20 MG tablet Take 1 tablet (20 mg total) by mouth daily. 90 tablet 3   vitamin B-12 (CYANOCOBALAMIN) 1000 MCG tablet Take 1,000 mcg by mouth daily.     No current facility-administered medications for this visit.    Review of Systems  Constitutional:  Positive for fatigue and unexpected weight change. Negative for appetite change, chills and fever.  HENT:   Negative for hearing loss and voice change.   Eyes:  Negative for eye problems.  Respiratory:  Negative for chest tightness, cough and shortness of breath.   Cardiovascular:  Negative for chest pain.  Gastrointestinal:  Positive for abdominal pain. Negative for abdominal distention and blood in stool.  Endocrine: Negative for hot flashes.  Genitourinary:  Negative for difficulty urinating and frequency.   Musculoskeletal:  Negative for arthralgias.  Skin:  Negative for itching and rash.  Neurological:  Negative for extremity weakness.  Hematological:  Negative for adenopathy.  Psychiatric/Behavioral:  Negative for confusion.     PHYSICAL EXAMINATION: ECOG PERFORMANCE STATUS: 1 - Symptomatic but completely ambulatory  Vitals:   04/07/22 1450  BP: 137/62  Pulse: 77  Resp: 18  Temp: 98.1 F (36.7 C)  SpO2: 100%   Filed Weights   04/07/22 1450  Weight: 110 lb (49.9 kg)    Physical Exam Constitutional:      General: She is not in acute distress. HENT:     Head: Normocephalic.     Nose: Nose normal.     Mouth/Throat:     Pharynx: No oropharyngeal exudate.  Eyes:     General: No scleral icterus. Cardiovascular:     Rate and Rhythm: Normal rate.  Pulmonary:     Effort: Pulmonary effort is normal. No respiratory distress.  Abdominal:     General: There is no distension.     Palpations: Abdomen is soft.  Musculoskeletal:        General: Normal range of motion.     Cervical back: Normal range of motion.  Skin:     General: Skin is warm and dry.  Neurological:     Mental Status: She is alert and oriented to person, place, and time. Mental status is at baseline.     Cranial Nerves: No cranial nerve deficit.     Motor: No abnormal  muscle tone.  Psychiatric:        Mood and Affect: Mood and affect normal.     LABORATORY DATA:  I have reviewed the data as listed    Latest Ref Rng & Units 04/01/2022    7:43 AM 03/21/2022   10:30 AM 10/06/2021    9:39 AM  CBC  WBC 4.0 - 10.5 K/uL 14.7  8.3  7.0   Hemoglobin 12.0 - 15.0 g/dL 11.4  12.4  14.0   Hematocrit 36.0 - 46.0 % 35.5  36.9  43.0   Platelets 150 - 400 K/uL 411  416.0  201.0       Latest Ref Rng & Units 03/21/2022    2:15 PM 03/21/2022   10:30 AM 09/17/2021   10:40 AM  CMP  Glucose 70 - 99 mg/dL  154  139   BUN 6 - 23 mg/dL  8  14   Creatinine 0.44 - 1.00 mg/dL 0.40  0.58  0.63   Sodium 135 - 145 mEq/L  139  139   Potassium 3.5 - 5.1 mEq/L  3.3  3.6   Chloride 96 - 112 mEq/L  99  102   CO2 19 - 32 mEq/L  26  26   Calcium 8.4 - 10.5 mg/dL  9.1  9.8   Total Protein 6.0 - 8.3 g/dL  5.9  7.3   Total Bilirubin 0.2 - 1.2 mg/dL  0.4  0.5   Alkaline Phos 39 - 117 U/L  55  46   AST 0 - 37 U/L  24  22   ALT 0 - 35 U/L  15  16     RADIOGRAPHIC STUDIES: I have personally reviewed the radiological images as listed and agreed with the findings in the report. CT ABDOMINAL MASS BIOPSY  Result Date: 04/01/2022 INDICATION: Omental mass EXAM: 1. CT-guided paracentesis 2. CT-guided core needle biopsy of omental mass MEDICATIONS: None. ANESTHESIA/SEDATION: Moderate (conscious) sedation was employed during this procedure. A total of Versed 1 mg and Fentanyl 75 mcg was administered intravenously. Moderate Sedation Time: 30 minutes. The patient's level of consciousness and vital signs were monitored continuously by radiology nursing throughout the procedure under my direct supervision. FLUOROSCOPY TIME:  N/a COMPLICATIONS: None immediate. PROCEDURE: Informed  written consent was obtained from the patient after a thorough discussion of the procedural risks, benefits and alternatives. All questions were addressed. Maximal Sterile Barrier Technique was utilized including caps, mask, sterile gowns, sterile gloves, sterile drape, hand hygiene and skin antiseptic. A timeout was performed prior to the initiation of the procedure. The patient was placed supine on the exam table. Limited CT of the abdomen and pelvis was performed for planning purposes. This demonstrated moderate volume ascites. Skin entry site was marked, and the overlying skin was prepped and draped in the standard sterile fashion. Local analgesia was obtained with 1% lidocaine. Using intermittent CT fluoroscopy, a 19 gauge Yueh catheter was advanced into a pocket of fluid in the right lower quadrant. Approximately 400 mL of amber fluid was removed, and sent to the lab for analysis. A clean dressing was placed. Attention was then turned to the omental mass. Using intermittent CT fluoroscopy, a 17 gauge introducer needle was advanced towards the identified lesion. Subsequently, core needle biopsy was performed using an 18 gauge core biopsy device x4 total passes. Specimens were submitted in formalin to pathology for further handling. Limited postprocedure imaging demonstrated no complicating feature. The patient tolerated the procedure well, and was transferred to recovery in  stable condition. IMPRESSION: 1. Successful CT-guided paracentesis with removal of 400 mL of amber fluid, with a sample sent for fluid cytology. 2. Successful CT-guided core needle biopsy of omental mass. Electronically Signed   By: Albin Felling M.D.   On: 04/01/2022 11:24   CT ASPIRATION N/S  Result Date: 04/01/2022 INDICATION: Omental mass EXAM: 1. CT-guided paracentesis 2. CT-guided core needle biopsy of omental mass MEDICATIONS: None. ANESTHESIA/SEDATION: Moderate (conscious) sedation was employed during this procedure. A total of  Versed 1 mg and Fentanyl 75 mcg was administered intravenously. Moderate Sedation Time: 30 minutes. The patient's level of consciousness and vital signs were monitored continuously by radiology nursing throughout the procedure under my direct supervision. FLUOROSCOPY TIME:  N/a COMPLICATIONS: None immediate. PROCEDURE: Informed written consent was obtained from the patient after a thorough discussion of the procedural risks, benefits and alternatives. All questions were addressed. Maximal Sterile Barrier Technique was utilized including caps, mask, sterile gowns, sterile gloves, sterile drape, hand hygiene and skin antiseptic. A timeout was performed prior to the initiation of the procedure. The patient was placed supine on the exam table. Limited CT of the abdomen and pelvis was performed for planning purposes. This demonstrated moderate volume ascites. Skin entry site was marked, and the overlying skin was prepped and draped in the standard sterile fashion. Local analgesia was obtained with 1% lidocaine. Using intermittent CT fluoroscopy, a 19 gauge Yueh catheter was advanced into a pocket of fluid in the right lower quadrant. Approximately 400 mL of amber fluid was removed, and sent to the lab for analysis. A clean dressing was placed. Attention was then turned to the omental mass. Using intermittent CT fluoroscopy, a 17 gauge introducer needle was advanced towards the identified lesion. Subsequently, core needle biopsy was performed using an 18 gauge core biopsy device x4 total passes. Specimens were submitted in formalin to pathology for further handling. Limited postprocedure imaging demonstrated no complicating feature. The patient tolerated the procedure well, and was transferred to recovery in stable condition. IMPRESSION: 1. Successful CT-guided paracentesis with removal of 400 mL of amber fluid, with a sample sent for fluid cytology. 2. Successful CT-guided core needle biopsy of omental mass.  Electronically Signed   By: Albin Felling M.D.   On: 04/01/2022 11:24   CT Abdomen Pelvis W Contrast  Result Date: 03/21/2022 CLINICAL DATA:  Epigastric abdominal pain for 1 month. EXAM: CT ABDOMEN AND PELVIS WITH CONTRAST TECHNIQUE: Multidetector CT imaging of the abdomen and pelvis was performed using the standard protocol following bolus administration of intravenous contrast. RADIATION DOSE REDUCTION: This exam was performed according to the departmental dose-optimization program which includes automated exposure control, adjustment of the mA and/or kV according to patient size and/or use of iterative reconstruction technique. CONTRAST:  60m OMNIPAQUE IOHEXOL 300 MG/ML  SOLN COMPARISON:  None Available. FINDINGS: Lower chest: No acute abnormality. Hepatobiliary: No gallstones or biliary dilatation is noted. Small hepatic cysts are noted. Pancreas: Marked atrophy is noted without acute inflammation or ductal dilatation. Spleen: Normal in size without focal abnormality. Adrenals/Urinary Tract: Adrenal glands are unremarkable. Kidneys are normal, without renal calculi, focal lesion, or hydronephrosis. Bladder is unremarkable. Stomach/Bowel: The stomach appears normal. There is no evidence of bowel obstruction or inflammation. The appendix appears normal. Sigmoid diverticulosis is noted without inflammation. Vascular/Lymphatic: Aortic atherosclerosis. No enlarged abdominal or pelvic lymph nodes. Reproductive: Uterus and bilateral adnexa are unremarkable. Other: Minimal ascites is noted. Extensive omental caking is noted in the left upper quadrant consistent with peritoneal carcinomatosis. Small  peritoneal implant is noted anteriorly in the upper abdomen. Another probable peritoneal implant is noted in the pelvis adjacent to small bowel loops measuring 3.1 x 2.3 cm. Musculoskeletal: No acute or significant osseous findings. IMPRESSION: Extensive omental caking is noted in left upper quadrant consistent with  peritoneal carcinomatosis. Peritoneal implants are also noted anteriorly in the upper abdomen as well as in the pelvis, also consistent with peritoneal carcinomatosis. Minimal ascites is noted. These results will be called to the ordering clinician or representative by the Radiologist Assistant, and communication documented in the PACS or zVision Dashboard. Sigmoid diverticulosis without inflammation. Pancreatic atrophy is noted. Aortic Atherosclerosis (ICD10-I70.0). Electronically Signed   By: Marijo Conception M.D.   On: 03/21/2022 15:03   US Venous Img Lower Bilateral  Result Date: 03/21/2022 CLINICAL DATA:  86 year old female with edema EXAM: BILATERAL LOWER EXTREMITY VENOUS DOPPLER ULTRASOUND TECHNIQUE: Gray-scale sonography with graded compression, as well as color Doppler and duplex ultrasound were performed to evaluate the lower extremity deep venous systems from the level of the common femoral vein and including the common femoral, femoral, profunda femoral, popliteal and calf veins including the posterior tibial, peroneal and gastrocnemius veins when visible. The superficial great saphenous vein was also interrogated. Spectral Doppler was utilized to evaluate flow at rest and with distal augmentation maneuvers in the common femoral, femoral and popliteal veins. COMPARISON:  None Available. FINDINGS: RIGHT LOWER EXTREMITY Common Femoral Vein: No evidence of thrombus. Normal compressibility, respiratory phasicity and response to augmentation. Saphenofemoral Junction: No evidence of thrombus. Normal compressibility and flow on color Doppler imaging. Profunda Femoral Vein: No evidence of thrombus. Normal compressibility and flow on color Doppler imaging. Femoral Vein: No evidence of thrombus. Normal compressibility, respiratory phasicity and response to augmentation. Popliteal Vein: No evidence of thrombus. Normal compressibility, respiratory phasicity and response to augmentation. Calf Veins: No evidence of  thrombus. Normal compressibility and flow on color Doppler imaging. Superficial Great Saphenous Vein: No evidence of thrombus. Normal compressibility and flow on color Doppler imaging. Other Findings: Lentiform fluid in the popliteal region, 3.0 cm x 1.1 cm x 5.5 cm, compatible with Baker's cyst. LEFT LOWER EXTREMITY Common Femoral Vein: No evidence of thrombus. Normal compressibility, respiratory phasicity and response to augmentation. Saphenofemoral Junction: No evidence of thrombus. Normal compressibility and flow on color Doppler imaging. Profunda Femoral Vein: No evidence of thrombus. Normal compressibility and flow on color Doppler imaging. Femoral Vein: No evidence of thrombus. Normal compressibility, respiratory phasicity and response to augmentation. Popliteal Vein: No evidence of thrombus. Normal compressibility, respiratory phasicity and response to augmentation. Calf Veins: No evidence of thrombus. Normal compressibility and flow on color Doppler imaging. Superficial Great Saphenous Vein: No evidence of thrombus. Normal compressibility and flow on color Doppler imaging. Other Findings: Lentiform fluid in the popliteal region, 5.6 cm x 1.2 cm x 2.5 cm, compatible with Baker's cyst. IMPRESSION: Directed duplex bilateral lower extremities negative for DVT. Bilateral Baker's cyst Signed, Dulcy Fanny. Nadene Rubins, RPVI [facility] Electronically Signed   By: Corrie Mckusick D.O.   On: 03/21/2022 13:38    ASSESSMENT & PLAN:   No problem-specific Assessment & Plan notes found for this encounter.   Orders Placed This Encounter  Procedures   Ambulatory referral to Genetics    Referral Priority:   Routine    Referral Type:   Consultation    Referral Reason:   Specialty Services Required    Number of Visits Requested:   1  Follow  up 1 week after biopsy   All questions were answered. The patient knows to call the clinic with any problems,  questions or concerns. No barriers to learning was detected.  Earlie Server, MD 04/07/2022

## 2022-04-09 ENCOUNTER — Encounter: Payer: Self-pay | Admitting: Oncology

## 2022-04-09 NOTE — Assessment & Plan Note (Addendum)
Discussed with patient and family 

## 2022-04-09 NOTE — Assessment & Plan Note (Addendum)
Images were reviewed by me and discussed with patient and daughter.  primary peritoneal carcinomatosis, CT did not reveal any Uterus and bilateral adnexa lesions.  CA125 elevated at 482. Elevated CA 27.29, CA15.3  PET is pending.  Biopsy results was reviewed and discussed with patient.  Refer to [employer].  Recommend Genetic testing/HRD Recommend chemotherapy with carboplatin and Taxol  The diagnosis and care plan were discussed with patient in detail.The goal of treatment which is to palliate disease, disease related symptoms, improve quality of life and hopefully prolong life was highlighted in our discussion.  Chemotherapy education was provided.  We had discussed the composition of chemotherapy regimen, length of chemo cycle, duration of treatment and the time to assess response to treatment.    I explained to the patient the risks and benefits of chemotherapy carboplatin and Taxol including all but not limited to hair loss, mouth sore, nausea, vomiting, diarrhea, low blood counts, bleeding, neuropathy and risk of life threatening infection and even death, secondary malignancy etc.  . Patient and daughter are undecided and will discuss with family members. Pending PET and Gynonc evaluation.   # Chemotherapy education;  Antiemetics-Zofran and Compazine; EMLA cream will be sent to pharmacy

## 2022-04-09 NOTE — Progress Notes (Signed)
START OFF PATHWAY REGIMEN - Other   [id number]:Carboplatin AUC=5 IV D1 + Paclitaxel 135 mg/m2 IV D1 q21 Days:   A cycle is every 21 days:     Paclitaxel      Carboplatin   **Always confirm dose/schedule in your pharmacy ordering system**  Patient Characteristics: Intent of Therapy: Non-Curative / Palliative Intent, Discussed with Patient 

## 2022-04-10 ENCOUNTER — Other Ambulatory Visit: Payer: Self-pay

## 2022-04-11 ENCOUNTER — Inpatient Hospital Stay: Payer: No Typology Code available for payment source | Admitting: Oncology

## 2022-04-12 ENCOUNTER — Other Ambulatory Visit: Payer: Self-pay

## 2022-04-12 ENCOUNTER — Inpatient Hospital Stay: Payer: No Typology Code available for payment source

## 2022-04-12 DIAGNOSIS — E119 Type 2 diabetes mellitus without complications: Secondary | ICD-10-CM

## 2022-04-13 ENCOUNTER — Ambulatory Visit
Admission: RE | Admit: 2022-04-13 | Discharge: 2022-04-13 | Disposition: A | Discharge status: Home / Self Care | Payer: No Typology Code available for payment source | Source: Ambulatory Visit | Attending: Oncology | Admitting: Oncology

## 2022-04-13 DIAGNOSIS — J90 Pleural effusion, not elsewhere classified: Secondary | ICD-10-CM | POA: Diagnosis not present

## 2022-04-13 DIAGNOSIS — R188 Other ascites: Secondary | ICD-10-CM | POA: Insufficient documentation

## 2022-04-13 DIAGNOSIS — C786 Secondary malignant neoplasm of retroperitoneum and peritoneum: Secondary | ICD-10-CM | POA: Diagnosis not present

## 2022-04-13 DIAGNOSIS — I700 Atherosclerosis of aorta: Secondary | ICD-10-CM | POA: Insufficient documentation

## 2022-04-13 DIAGNOSIS — C481 Malignant neoplasm of specified parts of peritoneum: Secondary | ICD-10-CM | POA: Diagnosis not present

## 2022-04-13 LAB — GLUCOSE, CAPILLARY: Glucose-Capillary: 127 mg/dL — ABNORMAL HIGH (ref 70–99)

## 2022-04-13 MED ORDER — FLUDEOXYGLUCOSE F - 18 (FDG) INJECTION
5.7000 | Freq: Once | INTRAVENOUS | Status: AC | PRN
  Administered 2022-04-13: 6.34 via INTRAVENOUS

## 2022-04-17 ENCOUNTER — Other Ambulatory Visit: Payer: Self-pay

## 2022-04-18 ENCOUNTER — Other Ambulatory Visit: Payer: Self-pay | Admitting: Oncology

## 2022-04-18 DIAGNOSIS — C786 Secondary malignant neoplasm of retroperitoneum and peritoneum: Secondary | ICD-10-CM

## 2022-04-18 MED ORDER — DEXAMETHASONE 4 MG PO TABS: ORAL_TABLET | ORAL | 1 refills | Status: DC

## 2022-04-18 MED ORDER — PROCHLORPERAZINE MALEATE 10 MG PO TABS: 10.0000 mg | ORAL_TABLET | Freq: Four times a day (QID) | ORAL | 1 refills | Status: DC | PRN

## 2022-04-18 MED ORDER — ONDANSETRON HCL 8 MG PO TABS: 8.0000 mg | ORAL_TABLET | Freq: Three times a day (TID) | ORAL | 1 refills | Status: DC | PRN

## 2022-04-19 ENCOUNTER — Telehealth: Payer: Self-pay

## 2022-04-19 NOTE — Telephone Encounter (Signed)
-----   Message from Earlie Server, MD sent at 04/18/2022 10:00 PM EDT ----- Please let patient/daughter know that I have reviewed her PET scan. I recommend her to start chemotherapy. We discussed about that during her last visit.  Please arrange lab MD carboplatin Taxol asap thanks.

## 2022-04-19 NOTE — Telephone Encounter (Signed)
Spoke to pt and she confirmed appt.

## 2022-04-19 NOTE — Telephone Encounter (Signed)
Called pt, no answer. Detailed Vm left. Mychart message sent as well.   Please arrange pt for La/bMD/ Carboplatin/ Taxol per IS and inform pt of appt.

## 2022-04-20 ENCOUNTER — Inpatient Hospital Stay (HOSPITAL_BASED_OUTPATIENT_CLINIC_OR_DEPARTMENT_OTHER): Payer: No Typology Code available for payment source | Admitting: Hospice and Palliative Medicine

## 2022-04-20 DIAGNOSIS — C786 Secondary malignant neoplasm of retroperitoneum and peritoneum: Secondary | ICD-10-CM

## 2022-04-20 NOTE — Progress Notes (Signed)
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 The [place] considered available treatment options and made the following recommendations/referrals:  Genetics, nutrition, SW, recommend palliative care  The MOC is a meeting of clinicians from various specialty areas who evaluate and discuss patients for whom a multidisciplinary approach is being considered. Final determinations in the plan of care are those of the provider(s).   Today's extended care, comprehensive team conference, Erin Cook was not present for the discussion and was not examined.

## 2022-04-21 DIAGNOSIS — E1169 Type 2 diabetes mellitus with other specified complication: Secondary | ICD-10-CM | POA: Diagnosis not present

## 2022-04-21 DIAGNOSIS — E785 Hyperlipidemia, unspecified: Secondary | ICD-10-CM | POA: Diagnosis not present

## 2022-04-21 DIAGNOSIS — Z681 Body mass index (BMI) 19 or less, adult: Secondary | ICD-10-CM | POA: Diagnosis not present

## 2022-04-21 DIAGNOSIS — Z008 Encounter for other general examination: Secondary | ICD-10-CM | POA: Diagnosis not present

## 2022-04-21 DIAGNOSIS — I700 Atherosclerosis of aorta: Secondary | ICD-10-CM | POA: Diagnosis not present

## 2022-04-21 DIAGNOSIS — E039 Hypothyroidism, unspecified: Secondary | ICD-10-CM | POA: Diagnosis not present

## 2022-04-22 ENCOUNTER — Inpatient Hospital Stay: Payer: No Typology Code available for payment source | Admitting: Licensed Clinical Social Worker

## 2022-04-22 MED FILL — Dexamethasone Sodium Phosphate Inj 100 MG/10ML: INTRAMUSCULAR | Qty: 1 | Status: AC

## 2022-04-22 MED FILL — Fosaprepitant Dimeglumine For IV Infusion 150 MG (Base Eq): INTRAVENOUS | Qty: 5 | Status: AC

## 2022-04-22 NOTE — Progress Notes (Signed)
Erin Cook  Initial Assessment   Erin Cook is a 86 y.o. year old female contacted by phone. Clinical Social Cook was referred by medical provider for assessment of psychosocial needs.   SDOH (Social Determinants of Health) assessments performed: Yes SDOH Interventions    Flowsheet Row Clinical Support from 04/22/2022 in [place] [facility] [place] Interventions   Food Insecurity Interventions Intervention Not Indicated  Housing Interventions Intervention Not Indicated  Transportation Interventions Patient Resources (Friends/Family), Intervention Not Indicated  Utilities Interventions Intervention Not Indicated  Alcohol Usage Interventions Intervention Not Indicated (Score <7)  Financial Strain Interventions Intervention Not Indicated  Physical Activity Interventions Intervention Not Indicated  Stress Interventions Intervention Not Indicated  Social Connections Interventions Intervention Not Indicated       SDOH Screenings   Food Insecurity: No Food Insecurity (04/22/2022)  Housing: Low Risk  (04/22/2022)  Transportation Needs: No Transportation Needs (04/22/2022)  Utilities: Not At Risk (04/22/2022)  Alcohol Screen: Low Risk  (04/22/2022)  Depression (PHQ2-9): Low Risk  (04/22/2022)  Financial Resource Strain: Low Risk  (04/22/2022)  Physical Activity: Sufficiently Active (04/22/2022)  Social Connections: Socially Integrated (04/22/2022)  Stress: No Stress Concern Present (04/22/2022)  Tobacco Use: Low Risk  (04/13/2022)     Distress Screen completed: No    03/25/2022   11:15 AM  ONCBCN DISTRESS SCREENING  Screening Type Initial Screening  Distress experienced in past week (1-10) 0      Family/Social Information:  Housing Arrangement: patient lives with spouse  Erin, Cook [phone]  Family members/support persons in your life? Family, Friends, Social worker, and Geophysical data processor concerns: no  Employment:  Retired  .  Income source: Paediatric nurse concerns: No Type of concern: None Food access concerns: no Religious or spiritual practice: Yes-Christian Services Currently in place:  [place]  Coping/ Adjustment to diagnosis: Patient understands treatment plan and what happens next? yes Concerns about diagnosis and/or treatment: I'm not especially worried about anything Patient reported stressors:  No stressors reported Hopes and/or priorities: N/A Patient enjoys exercise and time with family/ friends Current coping skills/ strengths: Ability for insight , Active sense of humor , Average or above average intelligence , Capable of independent living , Communication skills , Scientist, research (life sciences) , General fund of knowledge , Motivation for treatment/growth , Physical Health , Religious Affiliation , Special hobby/interest , and Supportive family/friends     SUMMARY: Current SDOH Barriers:  No SDOH barriers reported  Clinical Social Cook Clinical Goal(s):  No clinical social Cook goals at this time  Interventions: Discussed common feeling and emotions when being diagnosed with cancer, and the importance of support during treatment Informed patient of the support team roles and support services at [facility] Provided CSW contact information and encouraged patient to call with any questions or concerns Provided patient with information about CSW role in patient care and other available resources.   Follow Up Plan: Patient will contact CSW with any support or resource needs Patient verbalizes understanding of plan: Yes    [name], LCSW

## 2022-04-25 ENCOUNTER — Inpatient Hospital Stay (HOSPITAL_BASED_OUTPATIENT_CLINIC_OR_DEPARTMENT_OTHER): Payer: No Typology Code available for payment source | Admitting: Oncology

## 2022-04-25 ENCOUNTER — Inpatient Hospital Stay: Payer: No Typology Code available for payment source

## 2022-04-25 ENCOUNTER — Encounter: Payer: Self-pay | Admitting: Oncology

## 2022-04-25 DIAGNOSIS — C786 Secondary malignant neoplasm of retroperitoneum and peritoneum: Secondary | ICD-10-CM

## 2022-04-25 DIAGNOSIS — C482 Malignant neoplasm of peritoneum, unspecified: Secondary | ICD-10-CM | POA: Diagnosis not present

## 2022-04-25 DIAGNOSIS — Z7189 Other specified counseling: Secondary | ICD-10-CM

## 2022-04-25 DIAGNOSIS — R634 Abnormal weight loss: Secondary | ICD-10-CM | POA: Diagnosis not present

## 2022-04-25 NOTE — Assessment & Plan Note (Signed)
Refer to nutritionist 

## 2022-04-25 NOTE — Assessment & Plan Note (Signed)
Discussed with patient

## 2022-04-25 NOTE — Assessment & Plan Note (Addendum)
Images were reviewed by me and discussed with patient and daughter.  primary peritoneal carcinomatosis, CT/PET did not reveal any Uterus and bilateral adnexa lesions.  CA125 elevated at 482. Elevated CA 27.29, CA15.3  Biopsy results was reviewed and discussed with patient.  Recommend Genetic testing/HRD Recommend chemotherapy with carboplatin and Taxol. I have also reviewed the case with Gynonc Dr.Berchuck.  The diagnosis and care plan were discussed with patient in detail. Rationale and side effects were reviewed.  PET scan results were reviewed with patient and daughter.  Patient and daughter are undecided. Patient declines blood work today. She is leaning towards not proceeding with chemotherapy as she feels well for now. She has appt with GynOnc this week.   Refer to palliative care service.

## 2022-04-25 NOTE — Progress Notes (Signed)
Hematology/Oncology Consult Note [phone] [phone]     ASSESSMENT & PLAN:   Peritoneal carcinomatosis [facility]) Images were reviewed by me and discussed with patient and daughter.  primary peritoneal carcinomatosis, CT/PET did not reveal any Uterus and bilateral adnexa lesions.  CA125 elevated at 482. Elevated CA 27.29, CA15.3  Biopsy results was reviewed and discussed with patient.  Recommend Genetic testing/HRD Recommend chemotherapy with carboplatin and Taxol. I have also reviewed the case with Gynonc Dr.Berchuck.  The diagnosis and care plan were discussed with patient in detail. Rationale and side effects were reviewed.  PET scan results were reviewed with patient and daughter.  Patient and daughter are undecided. Patient declines blood work today. She is leaning towards not proceeding with chemotherapy as she feels well for now. She has appt with GynOnc this week.   Refer to palliative care service.   Weight loss Refer to nutritionist.   Goals of care, counseling/discussion Discussed with patient.    Orders Placed This Encounter  Procedures   Ambulatory Referral to Palliative Care    Referral Priority:   Routine    Referral Type:   Consultation    Referral Reason:   Goals of Care    Number of Visits Requested:   1   Ambulatory Referral to [facility] Nutrition    Referral Priority:   Routine    Referral Type:   Consultation    Referral Reason:   Specialty Services Required    Number of Visits Requested:   1   Follow up TBD, pending patient's decision.  All questions were answered. The patient knows to call the clinic with any problems, questions or concerns.  Earlie Server, MD, PhD [facility] Hematology Oncology 04/25/2022    CHIEF COMPLAINTS/PURPOSE OF CONSULTATION:  Peritoneal carcinomatosis  HISTORY OF PRESENTING ILLNESS:  Erin Cook 86 y.o. female presents to establish care for peritoneal carcinomatosis.  I have reviewed her chart and  materials related to her cancer extensively and collaborated history with the patient. Summary of oncologic history is as follows: Oncology History  Peritoneal carcinomatosis ([place])  03/21/2022 Imaging   CT abdomen pelvis w contrast Extensive omental caking is noted in left upper quadrant consistent with peritoneal carcinomatosis. Peritoneal implants are also noted anteriorly in the upper abdomen as well as in the pelvis, also consistent with peritoneal carcinomatosis. Minimal ascites is noted. Sigmoid diverticulosis without inflammation. Pancreatic atrophy is noted.   03/21/2022 Initial Diagnosis   Peritoneal carcinomatosis- CA125 482  -Patient has noticed unintentional weight loss 10 to 15 pounds in the past 2 months, decreased appetite and oral intake.  Intermittent diarrhea and abdominal discomfort.  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    05/16/2022 -  Chemotherapy   Patient is on Treatment Plan : Carboplatin + Paclitaxel (5/135) q21d      History of right breast cancer in 1976, s/p mastectomy and axillary lymph node dissection. She did not take any chemotherapy. She did not recall if she needed to take any endocrine therapy.   INTERVAL HISTORY Erin Cook is a 86 y.o. female who has above history reviewed by me today presents for follow up visit for primary peritoneal carcinomatosis  She reports feeling well. + lower extremity/knee pain.  Denies abdominal pain, bloating. She lost 2 pounds since last visit   MEDICAL HISTORY:  Past Medical History:  Diagnosis Date   Arthritis    hands   Breast cancer ([place])  1976   Car sickness    Diabetes mellitus without complication ([place])    GERD (gastroesophageal reflux disease)    HOH (hard of hearing)    mild   Hyperlipidemia    Hypothyroidism     SURGICAL HISTORY: Past Surgical History:  Procedure Laterality Date   BREAST  RECONSTRUCTION Bilateral 1978   CATARACT EXTRACTION W/PHACO Left 12/11/2017   Procedure: CATARACT EXTRACTION PHACO AND INTRAOCULAR LENS PLACEMENT ([place])  LEFT DIABETIC;  Surgeon: Leandrew Koyanagi, MD;  Location: [place];  Service: Ophthalmology;  Laterality: Left;  use left arm for BPs and IVs   CYST EXCISION  1980   In her chest.    MASTECTOMY Bilateral 1978   ROTATOR CUFF REPAIR Right July, 2015, Jan, 2016   Dr. Lisette Grinder.  Dr.  Amedeo Plenty.    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 Has one daughter, who lives in [place].      Work - Worked for [employer]. Retired in 1996.      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, occupational at [employer] for 20 years   Social Determinants of [employer]: [place]  (04/22/2022)   Overall Financial Resource Strain (CARDIA)    Difficulty of Paying Living Expenses: Not hard at all  Food Insecurity: No Food Insecurity (04/22/2022)   Hunger Vital Sign    Worried About Running Out of Food in the Last Year: Never true    Ran Out of Food in the Last Year: Never true  Transportation Needs: No Transportation Needs (04/22/2022)   PRAPARE - Hydrologist (Medical): No    Lack of Transportation (Non-Medical): No  Physical Activity: Sufficiently Active (04/22/2022)   Exercise Vital Sign    Days of Exercise  per Week: 5 days    Minutes of Exercise per Session: 30 min  Stress: No Stress Concern Present (04/22/2022)   [place]    Feeling of Stress : Only a little  Social Connections: Socially Integrated (04/22/2022)   Social Connection and Isolation Panel [NHANES]    Frequency of Communication with Friends and Family: More than three times a week    Frequency of Social Gatherings with Friends and Family: More than three times a week    Attends Religious Services: More than 4 times per year    Active Member of [employer] or Organizations: Yes    Attends Archivist Meetings: More than 4 times per year    Marital Status: Married  Human resources officer Violence: Not At Risk (04/22/2022)   Humiliation, Afraid, Rape, and Kick questionnaire    Fear of Current or Ex-Partner: No  Emotionally Abused: No    Physically Abused: No    Sexually Abused: No    FAMILY HISTORY: Family History  Problem Relation Age of Onset   Stroke Mother    Heart disease Mother    Heart disease Father    Stroke Father     ALLERGIES:  has No Known Allergies.  MEDICATIONS:  Current Outpatient Medications  Medication Sig Dispense Refill   Ascorbic Acid (VITAMIN C) 1000 MG tablet Take 1,000 mg by mouth daily.     blood glucose meter kit and supplies KIT Dispense based on patient and insurance preference. One Touch Verio Flex Meter  [phone]). Use once daily. Dx E11.9. 1 each 0   glucose blood test strip Use three times daily. One touch verio strips. 300 each 3   levothyroxine (SYNTHROID) 25 MCG tablet TAKE 2&1/2 TABLETS BY MOUTH DAILY WITH BREAKFAST 225 tablet 1   metFORMIN (GLUCOPHAGE) 500 MG tablet Take 2 tablets (1,000 mg total) by mouth 2 (two) times daily with a meal. 360 tablet 3   OneTouch Delica Lancets 27N MISC Used to check blood sugar one time a day. 100 each 1   rosuvastatin (CRESTOR) 20 MG tablet Take 1 tablet (20 mg total) by  mouth daily. 90 tablet 3   vitamin B-12 (CYANOCOBALAMIN) 1000 MCG tablet Take 1,000 mcg by mouth daily.     dexamethasone (DECADRON) 4 MG tablet Take 2 tablets (8 mg total) by mouth daily for 2 days. Start the day after chemotherapy. Take with food. (Patient not taking: Reported on 04/25/2022) 30 tablet 1   ondansetron (ZOFRAN) 8 MG tablet Take 1 tablet (8 mg total) by mouth every 8 (eight) hours as needed for nausea or vomiting. Start on the third day after chemotherapy. (Patient not taking: Reported on 04/25/2022) 30 tablet 1   prochlorperazine (COMPAZINE) 10 MG tablet Take 1 tablet (10 mg total) by mouth every 6 (six) hours as needed for nausea or vomiting. (Patient not taking: Reported on 04/25/2022) 30 tablet 1   No current facility-administered medications for this visit.    Review of Systems  Constitutional:  Positive for fatigue and unexpected weight change. Negative for appetite change, chills and fever.  HENT:   Negative for hearing loss and voice change.   Eyes:  Negative for eye problems.  Respiratory:  Negative for chest tightness, cough and shortness of breath.   Cardiovascular:  Negative for chest pain.  Gastrointestinal:  Positive for abdominal pain. Negative for abdominal distention and blood in stool.  Endocrine: Negative for hot flashes.  Genitourinary:  Negative for difficulty urinating and frequency.   Musculoskeletal:  Negative for arthralgias.  Skin:  Negative for itching and rash.  Neurological:  Negative for extremity weakness.  Hematological:  Negative for adenopathy.  Psychiatric/Behavioral:  Negative for confusion.     PHYSICAL EXAMINATION: ECOG PERFORMANCE STATUS: 1 - Symptomatic but completely ambulatory  Vitals:   04/25/22 0845  BP: (!) 140/65  Pulse: 72  Resp: 18  Temp: 98 F (36.7 C)   Filed Weights   04/25/22 0845  Weight: 109 lb 11.2 oz (49.8 kg)    Physical Exam Constitutional:      General: She is not in acute distress. HENT:     Head:  Normocephalic.     Nose: Nose normal.     Mouth/Throat:     Pharynx: No oropharyngeal exudate.  Eyes:     General: No scleral icterus. Cardiovascular:     Rate and Rhythm: Normal rate.  Pulmonary:  Effort: Pulmonary effort is normal. No respiratory distress.  Abdominal:     General: There is no distension.     Palpations: Abdomen is soft.  Musculoskeletal:        General: Normal range of motion.     Cervical back: Normal range of motion.  Skin:    General: Skin is warm and dry.  Neurological:     Mental Status: She is alert and oriented to person, place, and time. Mental status is at baseline.     Cranial Nerves: No cranial nerve deficit.     Motor: No abnormal muscle tone.  Psychiatric:        Mood and Affect: Mood and affect normal.      LABORATORY DATA:  I have reviewed the data as listed    Latest Ref Rng & Units 04/01/2022    7:43 AM 03/21/2022   10:30 AM 10/06/2021    9:39 AM  CBC  WBC 4.0 - 10.5 K/uL 14.7  8.3  7.0   Hemoglobin 12.0 - 15.0 g/dL 11.4  12.4  14.0   Hematocrit 36.0 - 46.0 % 35.5  36.9  43.0   Platelets 150 - 400 K/uL 411  416.0  201.0       Latest Ref Rng & Units 03/21/2022    2:15 PM 03/21/2022   10:30 AM 09/17/2021   10:40 AM  CMP  Glucose 70 - 99 mg/dL  154  139   BUN 6 - 23 mg/dL  8  14   Creatinine 0.44 - 1.00 mg/dL 0.40  0.58  0.63   Sodium 135 - 145 mEq/L  139  139   Potassium 3.5 - 5.1 mEq/L  3.3  3.6   Chloride 96 - 112 mEq/L  99  102   CO2 19 - 32 mEq/L  26  26   Calcium 8.4 - 10.5 mg/dL  9.1  9.8   Total Protein 6.0 - 8.3 g/dL  5.9  7.3   Total Bilirubin 0.2 - 1.2 mg/dL  0.4  0.5   Alkaline Phos 39 - 117 U/L  55  46   AST 0 - 37 U/L  24  22   ALT 0 - 35 U/L  15  16     RADIOGRAPHIC STUDIES: I have personally reviewed the radiological images as listed and agreed with the findings in the report. NM PET Image Initial (PI) Skull Base To Thigh  Result Date: 04/15/2022 CLINICAL DATA:  Additional treatment strategy for peritoneal  carcinomatosis. EXAM: NUCLEAR MEDICINE PET SKULL BASE TO THIGH TECHNIQUE: 6.3 mCi F-18 FDG was injected intravenously. Full-ring PET imaging was performed from the skull base to thigh after the radiotracer. CT data was obtained and used for attenuation correction and anatomic localization. Fasting blood glucose: 127 mg/dl COMPARISON:  CT abdomen and pelvis dated March 21, 2022 FINDINGS: Mediastinal blood pool activity: SUV max 1.3 Liver activity: SUV max 1.8 NECK: No hypermetabolic lymph nodes in the neck. Incidental CT findings: None. CHEST: No hypermetabolic mediastinal or hilar nodes. No suspicious pulmonary nodules on the CT scan. Incidental CT findings: Normal caliber thoracic aorta with moderate atherosclerotic disease. Mild coronary artery calcifications. Trace left pleural effusion. Bilateral breast implants ABDOMEN/PELVIS: Numerous hypermetabolic subcapsular liver and peritoneal implants. Reference subcapsular liver implant located on fused image 113 with SUV max of 4.2, not well visualized on CT due to lack of IV contrast. Additional subcapsular implant located adjacent to the posterior right lobe of the liver measuring 9 mm on series  2, image 131 with SUV max of 4.8. Peritoneal implant located adjacent to the splenic hilum measuring a proximally 9 mm on series 2, image 119 with SUV max of 4.7. Small volume abdominal ascites with diffuse low-level FDG uptake and peritoneal thickening. Soft tissue fullness of the right anterolateral abdominal wall with increased FDG activity, likely related to prior biopsy. Incidental CT findings: Moderate calcified plaque of the thoracic aorta. Left parapelvic cysts, no specific follow-up imaging is recommended. Severe diverticulosis. SKELETON: No focal hypermetabolic activity to suggest skeletal metastasis.x Incidental CT findings: None. IMPRESSION: 1. Numerous hypermetabolic subcapsular liver and peritoneal implants with associated small volume abdominal ascites and  peritoneal thickening, findings are compatible with peritoneal carcinomatosis. 2. No evidence of metastatic disease in the chest. 3. Trace left pleural effusion. 4. Aortic Atherosclerosis (ICD10-I70.0). Electronically Signed   By: Yetta Glassman M.D.   On: 04/15/2022 09:42   CT ABDOMINAL MASS BIOPSY  Result Date: 04/01/2022 INDICATION: Omental mass EXAM: 1. CT-guided paracentesis 2. CT-guided core needle biopsy of omental mass MEDICATIONS: None. ANESTHESIA/SEDATION: Moderate (conscious) sedation was employed during this procedure. A total of Versed 1 mg and Fentanyl 75 mcg was administered intravenously. Moderate Sedation Time: 30 minutes. The patient's level of consciousness and vital signs were monitored continuously by radiology nursing throughout the procedure under my direct supervision. FLUOROSCOPY TIME:  N/a COMPLICATIONS: None immediate. PROCEDURE: Informed written consent was obtained from the patient after a thorough discussion of the procedural risks, benefits and alternatives. All questions were addressed. Maximal Sterile Barrier Technique was utilized including caps, mask, sterile gowns, sterile gloves, sterile drape, hand hygiene and skin antiseptic. A timeout was performed prior to the initiation of the procedure. The patient was placed supine on the exam table. Limited CT of the abdomen and pelvis was performed for planning purposes. This demonstrated moderate volume ascites. Skin entry site was marked, and the overlying skin was prepped and draped in the standard sterile fashion. Local analgesia was obtained with 1% lidocaine. Using intermittent CT fluoroscopy, a 19 gauge Yueh catheter was advanced into a pocket of fluid in the right lower quadrant. Approximately 400 mL of amber fluid was removed, and sent to the lab for analysis. A clean dressing was placed. Attention was then turned to the omental mass. Using intermittent CT fluoroscopy, a 17 gauge introducer needle was advanced towards the  identified lesion. Subsequently, core needle biopsy was performed using an 18 gauge core biopsy device x4 total passes. Specimens were submitted in formalin to pathology for further handling. Limited postprocedure imaging demonstrated no complicating feature. The patient tolerated the procedure well, and was transferred to recovery in stable condition. IMPRESSION: 1. Successful CT-guided paracentesis with removal of 400 mL of amber fluid, with a sample sent for fluid cytology. 2. Successful CT-guided core needle biopsy of omental mass. Electronically Signed   By: Albin Felling M.D.   On: 04/01/2022 11:24   CT ASPIRATION N/S  Result Date: 04/01/2022 INDICATION: Omental mass EXAM: 1. CT-guided paracentesis 2. CT-guided core needle biopsy of omental mass MEDICATIONS: None. ANESTHESIA/SEDATION: Moderate (conscious) sedation was employed during this procedure. A total of Versed 1 mg and Fentanyl 75 mcg was administered intravenously. Moderate Sedation Time: 30 minutes. The patient's level of consciousness and vital signs were monitored continuously by radiology nursing throughout the procedure under my direct supervision. FLUOROSCOPY TIME:  N/a COMPLICATIONS: None immediate. PROCEDURE: Informed written consent was obtained from the patient after a thorough discussion of the procedural risks, benefits and alternatives. All questions were addressed.  Maximal Sterile Barrier Technique was utilized including caps, mask, sterile gowns, sterile gloves, sterile drape, hand hygiene and skin antiseptic. A timeout was performed prior to the initiation of the procedure. The patient was placed supine on the exam table. Limited CT of the abdomen and pelvis was performed for planning purposes. This demonstrated moderate volume ascites. Skin entry site was marked, and the overlying skin was prepped and draped in the standard sterile fashion. Local analgesia was obtained with 1% lidocaine. Using intermittent CT fluoroscopy, a 19  gauge Yueh catheter was advanced into a pocket of fluid in the right lower quadrant. Approximately 400 mL of amber fluid was removed, and sent to the lab for analysis. A clean dressing was placed. Attention was then turned to the omental mass. Using intermittent CT fluoroscopy, a 17 gauge introducer needle was advanced towards the identified lesion. Subsequently, core needle biopsy was performed using an 18 gauge core biopsy device x4 total passes. Specimens were submitted in formalin to pathology for further handling. Limited postprocedure imaging demonstrated no complicating feature. The patient tolerated the procedure well, and was transferred to recovery in stable condition. IMPRESSION: 1. Successful CT-guided paracentesis with removal of 400 mL of amber fluid, with a sample sent for fluid cytology. 2. Successful CT-guided core needle biopsy of omental mass. Electronically Signed   By: Albin Felling M.D.   On: 04/01/2022 11:24

## 2022-04-27 ENCOUNTER — Encounter: Payer: Self-pay | Admitting: Obstetrics and Gynecology

## 2022-04-27 ENCOUNTER — Inpatient Hospital Stay (HOSPITAL_BASED_OUTPATIENT_CLINIC_OR_DEPARTMENT_OTHER): Payer: No Typology Code available for payment source | Admitting: Obstetrics and Gynecology

## 2022-04-27 DIAGNOSIS — C569 Malignant neoplasm of unspecified ovary: Secondary | ICD-10-CM | POA: Insufficient documentation

## 2022-04-27 DIAGNOSIS — C482 Malignant neoplasm of peritoneum, unspecified: Secondary | ICD-10-CM

## 2022-04-27 DIAGNOSIS — C786 Secondary malignant neoplasm of retroperitoneum and peritoneum: Secondary | ICD-10-CM | POA: Diagnosis not present

## 2022-04-27 NOTE — Telephone Encounter (Signed)
Please advise 

## 2022-04-27 NOTE — Progress Notes (Signed)
Gynecologic Oncology Consult Visit   Referring Provider: Dr Tasia Catchings  Chief Concern: peritoneal carcinoma  Subjective:  Erin Cook is a 86 y.o. P64 female who is seen in consultation from Dr. Caryl Bis for high grade serous peritoneal cancer.  She had breast cancer age 76.Marland Kitchen  Seen by primary care 03/21/22 Abdominal pain/navel nodule: Patient reports over the last month or so she has been having some abdominal pain in her upper abdomen.  She typically has bowel movements every 2 to 3 days though over the last couple of days she has had multiple loose stools.  She is been having loose stools for quite some time.  She notes the color of the stools is yellow.  She notes no blood in her stool.  No vomiting.  No nausea.  She has lost 20-30 pounds in the past few months.    9/22.23 CA125 482, CA15-5 = 60, CA27-29 = 88 Normal CEA  04/13/22 PET/CT FINDINGS: Mediastinal blood pool activity: SUV max 1.3   Liver activity: SUV max 1.8   NECK: No hypermetabolic lymph nodes in the neck.   Incidental CT findings: None.   CHEST: No hypermetabolic mediastinal or hilar nodes. No suspicious pulmonary nodules on the CT scan.   Incidental CT findings: Normal caliber thoracic aorta with moderate atherosclerotic disease. Mild coronary artery calcifications. Trace left pleural effusion. Bilateral breast implants   ABDOMEN/PELVIS: Numerous hypermetabolic subcapsular liver and peritoneal implants. Reference subcapsular liver implant located on fused image 113 with SUV max of 4.2, not well visualized on CT due to lack of IV contrast. Additional subcapsular implant located adjacent to the posterior right lobe of the liver measuring 9 mm on series 2, image 131 with SUV max of 4.8. Peritoneal implant located adjacent to the splenic hilum measuring a proximally 9 mm on series 2, image 119 with SUV max of 4.7. Small volume abdominal ascites with diffuse low-level FDG uptake and peritoneal thickening.  Soft tissue fullness of the right anterolateral abdominal wall with increased FDG activity, likely related to prior biopsy.   Incidental CT findings: Moderate calcified plaque of the thoracic aorta. Left parapelvic cysts, no specific follow-up imaging is recommended. Severe diverticulosis.   SKELETON: No focal hypermetabolic activity to suggest skeletal metastasis.x   Incidental CT findings: None.   IMPRESSION: 1. Numerous hypermetabolic subcapsular liver and peritoneal implants with associated small volume abdominal ascites and peritoneal thickening, findings are compatible with peritoneal carcinomatosis. 2. No evidence of metastatic disease in the chest. 3. Trace left pleural effusion. 4. Aortic Atherosclerosis (ICD10-I70.0).  04/01/22 CT directed biopsy DIAGNOSIS:  A. OMENTUM; CT-GUIDED CORE NEEDLE BIOPSY:  - POSITIVE FOR MALIGNANCY.  - METASTATIC ADENOCARCINOMA, FAVOR HIGH-GRADE SEROUS CARCINOMA OF  GYNECOLOGIC ORIGIN.   Comment:  Immunohistochemical studies show tumor cells to be positive for CK7,  Pax-8, and WT-1 (patchy), with focal dim positivity for ER and PR. p53  is diffusely positive, indicative of its mutated (non-wildtype) status.  CK20, CDX-2, TTF-1, and GATA-3 are negative. The findings favor a  high-grade serous carcinoma of gynecologic origin.    Ascites DIAGNOSIS:  A. ASCITES; ULTRASOUND-GUIDED PARACENTESIS:  - POSITIVE FOR MALIGNANCY.  - METASTATIC ADENOCARCINOMA, COMPATIBLE WITH HIGH-GRADE SEROUS CARCINOMA  OF GYNECOLOGIC ORIGIN.   Problem List: Patient Active Problem List   Diagnosis Date Noted   Peritoneal carcinomatosis ([place]) 03/25/2022   Goals of care, counseling/discussion 03/25/2022   Abdominal pain 03/21/2022   Urine frequency 03/21/2022   Skin tear of forearm without complication 10/96/0454   Bilateral lower extremity edema  03/21/2022   Weight loss 09/17/2021   Bruising 09/17/2021   OAB (overactive bladder) 09/17/2021   History of  shingles 04/12/2021   Back strain 04/10/2020   Disorder of bursae of shoulder region 04/01/2020   Sprain of shoulder and upper arm 04/01/2020   Trochanteric bursitis 12/10/2019   Varicose veins of both lower extremities 12/10/2019   Acquired overriding toe 12/10/2019   Carpal tunnel syndrome 04/10/2018   Full thickness rotator cuff tear 04/10/2018   Ulnar nerve palsy 04/10/2018   Hemangioma of skin 04/04/2018   Benign nevus 12/30/2016   Hypothyroidism, postop 09/17/2015   Lymphedema 09/17/2015   Barrett esophagus 02/10/2015   History of breast cancer 02/10/2015   Arthritis, degenerative 02/10/2015   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)    GERD (gastroesophageal reflux disease)    HOH (hard of hearing)    mild   Hyperlipidemia    Hypothyroidism     Past Surgical History: Past Surgical History:  Procedure Laterality Date   BREAST RECONSTRUCTION Bilateral 1978   CATARACT EXTRACTION W/PHACO Left 12/11/2017   Procedure: CATARACT EXTRACTION PHACO AND INTRAOCULAR LENS PLACEMENT ([place])  LEFT DIABETIC;  Surgeon: Leandrew Koyanagi, MD;  Location: [facility];  Service: Ophthalmology;  Laterality: Left;  use left arm for BPs and IVs   CYST EXCISION  1980   In her chest.    MASTECTOMY Bilateral 1978   ROTATOR CUFF REPAIR Right July, 2015, Jan, 2016   Dr. Lisette Grinder.  Dr.  Amedeo Plenty.    THYROIDECTOMY  1980   TONSILLECTOMY  1945      OB History:  OB History  Gravida Para Term Preterm AB Living  1 1          SAB IAB Ectopic Multiple Live Births               # Outcome Date GA Lbr Len/2nd Weight Sex Delivery Anes PTL Lv  1 Para             Family History: Family History  Problem Relation Age of Onset   Stroke Mother     Heart disease Mother    Heart disease Father    Stroke Father     Social History: Social History   Socioeconomic History   Marital status: Married    Spouse name: Herbie Baltimore "bob"   Number of children: 1   Years of education: H/S   Highest education level: Not on file  Occupational History   Occupation: Retired   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 Has one daughter, who lives in [place].      Work - Worked for [employer]. Retired in 1996.      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, occupational at [employer] for 20 years   Social  Determinants of Health   Financial Resource Strain: Low Risk  (04/22/2022)   Overall Financial Resource Strain (CARDIA)    Difficulty of Paying Living Expenses: Not hard at all  Food Insecurity: No Food Insecurity (04/22/2022)   Hunger Vital Sign    Worried About Running Out of Food in the Last Year: Never true    Ran Out of Food in the Last Year: Never true  Transportation Needs: No Transportation Needs (04/22/2022)   PRAPARE - Hydrologist (Medical): No    Lack of Transportation (Non-Medical): No  Physical Activity: Sufficiently Active (04/22/2022)   Exercise Vital Sign    Days of Exercise per Week: 5 days    Minutes of Exercise per Session: 30 min  Stress: No Stress Concern Present (04/22/2022)   [place]    Feeling of Stress : Only a little  Social Connections: Socially Integrated (04/22/2022)   Social Connection and Isolation Panel [NHANES]    Frequency of Communication with Friends and Family: More than three times a week    Frequency of Social  Gatherings with Friends and Family: More than three times a week    Attends Religious Services: More than 4 times per year    Active Member of [employer] or Organizations: Yes    Attends Music therapist: More than 4 times per year    Marital Status: Married  Human resources officer Violence: Not At Risk (04/22/2022)   Humiliation, Afraid, Rape, and Kick questionnaire    Fear of Current or Ex-Partner: No    Emotionally Abused: No    Physically Abused: No    Sexually Abused: No    Allergies: No Known Allergies  Current Medications: Current Outpatient Medications  Medication Sig Dispense Refill   Ascorbic Acid (VITAMIN C) 1000 MG tablet Take 1,000 mg by mouth daily.     blood glucose meter kit and supplies KIT Dispense based on patient and insurance preference. One Touch Verio Flex Meter  [phone]). Use once daily. Dx E11.9. 1 each 0   glucose blood test strip Use three times daily. One touch verio strips. 300 each 3   levothyroxine (SYNTHROID) 25 MCG tablet TAKE 2&1/2 TABLETS BY MOUTH DAILY WITH BREAKFAST 225 tablet 1   metFORMIN (GLUCOPHAGE) 500 MG tablet Take 2 tablets (1,000 mg total) by mouth 2 (two) times daily with a meal. 360 tablet 3   OneTouch Delica Lancets 32D MISC Used to check blood sugar one time a day. 100 each 1   rosuvastatin (CRESTOR) 20 MG tablet Take 1 tablet (20 mg total) by mouth daily. 90 tablet 3   vitamin B-12 (CYANOCOBALAMIN) 1000 MCG tablet Take 1,000 mcg by mouth daily.     dexamethasone (DECADRON) 4 MG tablet Take 2 tablets (8 mg total) by mouth daily for 2 days. Start the day after chemotherapy. Take with food. (Patient not taking: Reported on 04/25/2022) 30 tablet 1   ondansetron (ZOFRAN) 8 MG tablet Take 1 tablet (8 mg total) by mouth every 8 (eight) hours as needed for nausea or vomiting. Start on the third day after chemotherapy. (Patient not taking: Reported on 04/25/2022) 30 tablet 1   prochlorperazine (COMPAZINE) 10 MG tablet Take 1 tablet  (10 mg total) by mouth every 6 (six) hours as needed for nausea or vomiting. (Patient not taking: Reported on 04/25/2022) 30 tablet 1   No current facility-administered medications for this visit.    Review of Systems General: negative  for, fevers, chills, changes in sleep Skin: negative for changes in color, texture, moles or lesions Eyes: negative for, changes in vision, pain, diplopia HEENT: negative for, change in hearing, pain, discharge, tinnitus, vertigo, voice changes, sore throat, neck masses Breasts: negative for breast lumps Pulmonary: negative for, dyspnea, orthopnea, productive cough Cardiac: negative for, palpitations, syncope, pain, discomfort, pressure Genitourinary/Sexual: negative for, dysuria, discharge, hesitancy, nocturia, retention, stones, infections, STD's, incontinence Ob/Gyn: negative for, irregular bleeding, pain Musculoskeletal: negative for, pain, stiffness, swelling, range of motion limitation Hematology: negative for, easy bruising, bleeding Neurologic/Psych: negative for, headaches, seizures, paralysis, weakness, tremor, change in gait, change in sensation, mood swings, depression, anxiety  Objective:  Physical Examination:  BP 135/60 (Patient Position: Sitting)   Pulse 79   Temp 97.7 F (36.5 C)   Resp 17   Wt 109 lb (49.4 kg)   SpO2 100%   BMI 19.31 kg/m    ECOG Performance Status: 1 - Symptomatic but completely ambulatory  General appearance: alert, cooperative, and appears stated age HEENT:extra ocular movement intact Abdomen: 2 nodules palpable in umbilicus.  Non-tender no ascites. Back: inspection of back is normal Extremities: 1-2+ edema bilaterally Skin exam - normal coloration and turgor, no rashes, no suspicious skin lesions noted. Neurological exam reveals alert, oriented, normal speech, no focal findings or movement disorder noted.  Pelvic: deferred per patient.  Lab Review Labs on site today: Lab Results  Component Value Date    WBC 14.7 (H) 04/01/2022   HGB 11.4 (L) 04/01/2022   HCT 35.5 (L) 04/01/2022   MCV 98.1 04/01/2022   PLT 411 (H) 04/01/2022      Chemistry      Component Value Date/Time   NA 139 03/21/2022 1030   NA 143 02/11/2015 0824   NA 140 12/18/2013 1112   K 3.3 (L) 03/21/2022 1030   K 3.4 (L) 12/18/2013 1112   CL 99 03/21/2022 1030   CL 104 12/18/2013 1112   CO2 26 03/21/2022 1030   CO2 30 12/18/2013 1112   BUN 8 03/21/2022 1030   BUN 12 02/11/2015 0824   BUN 8 12/18/2013 1112   CREATININE 0.40 (L) 03/21/2022 1415   CREATININE 0.67 12/18/2013 1112      Component Value Date/Time   CALCIUM 9.1 03/21/2022 1030   CALCIUM 8.7 12/18/2013 1112   ALKPHOS 55 03/21/2022 1030   AST 24 03/21/2022 1030   ALT 15 03/21/2022 1030   BILITOT 0.4 03/21/2022 1030   BILITOT 0.9 02/11/2015 0824        Assessment:  Erin Cook is a 86 y.o. female diagnosed with high grade serous peritoneal cancer with PET/CT showing multiple intraperitoneal nodules and omental disease.  No masses or PET avidity in pelvic organs.   CA125 = 482  History of premenopausal breast cancer.   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  Discussed with her and daughter that there is a major potential benefit for treatment with gain of several years of life.  Also discussed possibility of interval debulking surgery, but can defer this decision for now.  She is unsure if she wants treatment and would like to think about it for a few weeks.  Discussed that is she continues to  lose weight and develops high grade SBO she may not be able to receive treatment.    Khorana score = 3 (ovarian/peritoneal cancer, and increased plts and  WBC).  So would recommend DOAC to reduce risk of VTE on neoadjuvant chemotherapy.  Discussed the need for genetic testing in view of her history of breast cancer.  She has a significant risk of carrying a BRCA1/2 or other high penetrance mutation. She has an appt next month.  Discussed implications for family members as well as potential for PARP inhibitor maintenance therapy after chemotherapy.   The patient's diagnosis, an outline of the further diagnostic and laboratory studies which will be required, the recommendation, and alternatives were discussed.  All questions were answered to the patient's satisfaction.  Mellody Drown, MD  CC:  Leone Haven, MD [address] [place],  [place] [address] [phone]

## 2022-04-29 ENCOUNTER — Inpatient Hospital Stay: Payer: No Typology Code available for payment source

## 2022-04-29 NOTE — Progress Notes (Signed)
Nutrition Assessment   Reason for Assessment:  Poor appetite   ASSESSMENT:  86 year old female with peritoneal carcinoma.  Past medical history of DM, HLD, barrett's esophagus, GERD, breast cancer at 8 with bilateral mastectomy.  Dr Tasia Catchings recommending neoadjuvant chemotherapy.  Patient undecided at this time.  Spoke with patient via phone for nutrition assessment.  Patient reports that she is not eating as much as usual. Typically has cornflakes with 1/2 banana and 2 cups coffee for breakfast.  Mid morning drinks glucerna shake (recently added).  Lunch is a cooked meal (meat and 1-2 sides) or sandwich.  Supper is similar to lunch.  Drink skim milk sometimes with meals.  Drinks 2nd glucerna mid afternoon.  Has had some stomach upset. Currently taking stool softner to keep bowels going every day.  Taking a antacid tablet to help with reflux.     Medications: Vit B 12, Vit C, metformin   Labs: reviewed   Anthropometrics:   Height: 63 inches Weight: 109 lb 10/25.  Patient feels like she was 20 lb heavier a few months ago. Per chart 113 lb on 12/20/2021 Per chart 117 lb on 10/23/20 BMI: 19  4% weight loss in the last 4 months 7% weight loss in the last year 6 months   Estimated Energy Needs  Kcals: 1250-1500 Protein: 63-75 g Fluid: 1250-1500 ml   NUTRITION DIAGNOSIS: Unintentional weight loss related to cancer and related side effects (stomach upset, reduced oral intake) as evidenced by 4% weight loss in the last 4 months and reduced oral intake    INTERVENTION:  Discussed ways to add calories and protein to diet.  Mailed High Calorie, High protein Diet and snack list.  Cautioned patient on increasing fat calories can sometimes increase stomach upset.  Patient will monitor.   Continue glucerna shake BID.  Mailed coupons Encouraged if stool softeners and reflux medication not working to discuss with MD Contact information mailed and encouraged patient to call RD if decides to  start treatment   MONITORING, EVALUATION, GOAL: weight trends, intake   Next Visit: no follow-up Patient will call RD if decides to start treatment  Catrice Zuleta B. Zenia Resides, [place], [place] Registered Dietitian [phone]

## 2022-05-04 ENCOUNTER — Encounter: Payer: Self-pay | Admitting: Hospice and Palliative Medicine

## 2022-05-04 ENCOUNTER — Encounter: Payer: Self-pay | Admitting: Oncology

## 2022-05-04 ENCOUNTER — Other Ambulatory Visit: Payer: Self-pay

## 2022-05-04 ENCOUNTER — Inpatient Hospital Stay
Payer: No Typology Code available for payment source | Attending: Hospice and Palliative Medicine | Admitting: Hospice and Palliative Medicine

## 2022-05-04 VITALS — BP 140/62 | HR 69 | Temp 97.6°F | Resp 18 | Ht 63.0 in | Wt 108.5 lb

## 2022-05-04 DIAGNOSIS — C801 Malignant (primary) neoplasm, unspecified: Secondary | ICD-10-CM | POA: Insufficient documentation

## 2022-05-04 DIAGNOSIS — C569 Malignant neoplasm of unspecified ovary: Secondary | ICD-10-CM

## 2022-05-04 DIAGNOSIS — M13861 Other specified arthritis, right knee: Secondary | ICD-10-CM | POA: Diagnosis not present

## 2022-05-04 DIAGNOSIS — E119 Type 2 diabetes mellitus without complications: Secondary | ICD-10-CM | POA: Diagnosis not present

## 2022-05-04 DIAGNOSIS — M25561 Pain in right knee: Secondary | ICD-10-CM | POA: Diagnosis not present

## 2022-05-04 DIAGNOSIS — Z66 Do not resuscitate: Secondary | ICD-10-CM | POA: Diagnosis not present

## 2022-05-04 NOTE — Progress Notes (Signed)
[place] at [facility] [phone] [phone]   Name: Erin Cook Date: 05/04/2022 MRN: [id number]  DOB: [birth year]  Patient Care Team: Leone Haven, MD as PCP - General (Family Medicine) Clent Jacks, RN as Oncology Nurse Navigator    REASON FOR CONSULTATION: Erin Cook is a 86 y.o. 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  Patient with elevated CA125 of 482.  PET/CT revealed numerous hypermetabolic subcapsular liver and peritoneal implants.  Patient underwent CT-guided biopsy on 04/01/2022 positive for metastatic adenocarcinoma favoring high-grade serous carcinoma of GYN origin.  Patient has been seen by GYN and medical oncology with discussion for chemotherapy and possible future debulking surgery.  Patient and daughter were undecided and therefore patient was referred to palliative care to discuss goals.  SOCIAL HISTORY:     reports that she has never smoked. She has never used smokeless tobacco. She reports that she does not drink alcohol and does not use drugs.  Patient is married lives at home with her husband.  She has a daughter who is involved in her care.  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  [place]:  Not on file  CODE STATUS: DNR  PAST MEDICAL HISTORY: Past Medical History:  Diagnosis Date   Arthritis    hands   Breast cancer ([place]) 1976   Car sickness    Diabetes mellitus without complication ([place])    GERD (gastroesophageal reflux disease)    HOH (hard of hearing)    mild   Hyperlipidemia    Hypothyroidism     PAST SURGICAL HISTORY:  Past Surgical History:  Procedure Laterality Date   BREAST RECONSTRUCTION Bilateral 1978   CATARACT EXTRACTION W/PHACO Left 12/11/2017   Procedure: CATARACT EXTRACTION PHACO AND INTRAOCULAR LENS PLACEMENT ([place])  LEFT  DIABETIC;  Surgeon: Leandrew Koyanagi, MD;  Location: [facility];  Service: Ophthalmology;  Laterality: Left;  use left arm for BPs and IVs   CYST EXCISION  1980   In her chest.    MASTECTOMY Bilateral 1978   ROTATOR CUFF REPAIR Right July, 2015, Jan, 2016   Dr. Lisette Grinder.  Dr.  Amedeo Plenty.    THYROIDECTOMY  1980   TONSILLECTOMY  1945    HEMATOLOGY/ONCOLOGY HISTORY:  Oncology History  Peritoneal carcinomatosis ([place])  03/21/2022 Imaging   CT abdomen pelvis w contrast Extensive omental caking is noted in left upper quadrant consistent with peritoneal carcinomatosis. Peritoneal implants are also noted anteriorly in the upper abdomen as well as in the pelvis, also consistent with peritoneal carcinomatosis. Minimal ascites is noted. Sigmoid diverticulosis without inflammation. Pancreatic atrophy is noted.   03/21/2022 Initial Diagnosis   Peritoneal carcinomatosis- CA125 482  -Patient has noticed unintentional weight loss 10 to 15 pounds in the past 2 months, decreased appetite and oral intake.  Intermittent diarrhea and abdominal discomfort.  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    05/16/2022 -  Chemotherapy   Patient is on Treatment Plan : Carboplatin + Paclitaxel (5/135) q21d       ALLERGIES:  has No Known Allergies.  MEDICATIONS:  Current Outpatient Medications  Medication Sig Dispense Refill   blood glucose meter kit and supplies KIT Dispense based on patient and insurance preference. One Touch Verio Flex Meter  [phone]). Use once daily. Dx E11.9. 1 each 0  Docusate Calcium (STOOL SOFTENER PO) Take 1 capsule by mouth daily.     glucose blood test strip Use three times daily. One touch verio strips. 300 each 3   levothyroxine (SYNTHROID) 25 MCG tablet TAKE 2&1/2 TABLETS BY MOUTH DAILY WITH BREAKFAST 225 tablet 1   metFORMIN (GLUCOPHAGE) 500 MG tablet Take 2  tablets (1,000 mg total) by mouth 2 (two) times daily with a meal. 360 tablet 3   OneTouch Delica Lancets 71G MISC Used to check blood sugar one time a day. 100 each 1   rosuvastatin (CRESTOR) 20 MG tablet Take 1 tablet (20 mg total) by mouth daily. 90 tablet 3   vitamin B-12 (CYANOCOBALAMIN) 1000 MCG tablet Take 1,000 mcg by mouth daily.     Ascorbic Acid (VITAMIN C) 1000 MG tablet Take 1,000 mg by mouth daily. (Patient not taking: Reported on 05/04/2022)     dexamethasone (DECADRON) 4 MG tablet Take 2 tablets (8 mg total) by mouth daily for 2 days. Start the day after chemotherapy. Take with food. (Patient not taking: Reported on 04/25/2022) 30 tablet 1   ondansetron (ZOFRAN) 8 MG tablet Take 1 tablet (8 mg total) by mouth every 8 (eight) hours as needed for nausea or vomiting. Start on the third day after chemotherapy. (Patient not taking: Reported on 04/25/2022) 30 tablet 1   prochlorperazine (COMPAZINE) 10 MG tablet Take 1 tablet (10 mg total) by mouth every 6 (six) hours as needed for nausea or vomiting. (Patient not taking: Reported on 04/25/2022) 30 tablet 1   No current facility-administered medications for this visit.    VITAL SIGNS: There were no vitals taken for this visit. There were no vitals filed for this visit.  Estimated body mass index is 19.31 kg/m as calculated from the following:   Height as of 04/04/22: 5' 3" (1.6 m).   Weight as of 04/27/22: 109 lb (49.4 kg).  LABS: CBC:    Component Value Date/Time   WBC 14.7 (H) 04/01/2022 0743   HGB 11.4 (L) 04/01/2022 0743   HGB 13.7 12/18/2013 1112   HCT 35.5 (L) 04/01/2022 0743   HCT 41.4 12/18/2013 1112   PLT 411 (H) 04/01/2022 0743   PLT 232 12/18/2013 1112   MCV 98.1 04/01/2022 0743   MCV 98 12/18/2013 1112   NEUTROABS 6.5 03/21/2022 1030   LYMPHSABS 1.0 03/21/2022 1030   MONOABS 0.7 03/21/2022 1030   EOSABS 0.0 03/21/2022 1030   BASOSABS 0.0 03/21/2022 1030   Comprehensive Metabolic Panel:    Component Value  Date/Time   NA 139 03/21/2022 1030   NA 143 02/11/2015 0824   NA 140 12/18/2013 1112   K 3.3 (L) 03/21/2022 1030   K 3.4 (L) 12/18/2013 1112   CL 99 03/21/2022 1030   CL 104 12/18/2013 1112   CO2 26 03/21/2022 1030   CO2 30 12/18/2013 1112   BUN 8 03/21/2022 1030   BUN 12 02/11/2015 0824   BUN 8 12/18/2013 1112   CREATININE 0.40 (L) 03/21/2022 1415   CREATININE 0.67 12/18/2013 1112   GLUCOSE 154 (H) 03/21/2022 1030   GLUCOSE 174 (H) 12/18/2013 1112   CALCIUM 9.1 03/21/2022 1030   CALCIUM 8.7 12/18/2013 1112   AST 24 03/21/2022 1030   ALT 15 03/21/2022 1030   ALKPHOS 55 03/21/2022 1030   BILITOT 0.4 03/21/2022 1030   BILITOT 0.9 02/11/2015 0824   PROT 5.9 (L) 03/21/2022 1030   PROT 6.1 02/11/2015 0824   ALBUMIN 3.6 03/21/2022 1030   ALBUMIN 4.2  02/11/2015 0824    RADIOGRAPHIC STUDIES: NM PET Image Initial (PI) Skull Base To Thigh  Result Date: 04/15/2022 CLINICAL DATA:  Additional treatment strategy for peritoneal carcinomatosis. EXAM: NUCLEAR MEDICINE PET SKULL BASE TO THIGH TECHNIQUE: 6.3 mCi F-18 FDG was injected intravenously. Full-ring PET imaging was performed from the skull base to thigh after the radiotracer. CT data was obtained and used for attenuation correction and anatomic localization. Fasting blood glucose: 127 mg/dl COMPARISON:  CT abdomen and pelvis dated March 21, 2022 FINDINGS: Mediastinal blood pool activity: SUV max 1.3 Liver activity: SUV max 1.8 NECK: No hypermetabolic lymph nodes in the neck. Incidental CT findings: None. CHEST: No hypermetabolic mediastinal or hilar nodes. No suspicious pulmonary nodules on the CT scan. Incidental CT findings: Normal caliber thoracic aorta with moderate atherosclerotic disease. Mild coronary artery calcifications. Trace left pleural effusion. Bilateral breast implants ABDOMEN/PELVIS: Numerous hypermetabolic subcapsular liver and peritoneal implants. Reference subcapsular liver implant located on fused image 113 with SUV  max of 4.2, not well visualized on CT due to lack of IV contrast. Additional subcapsular implant located adjacent to the posterior right lobe of the liver measuring 9 mm on series 2, image 131 with SUV max of 4.8. Peritoneal implant located adjacent to the splenic hilum measuring a proximally 9 mm on series 2, image 119 with SUV max of 4.7. Small volume abdominal ascites with diffuse low-level FDG uptake and peritoneal thickening. Soft tissue fullness of the right anterolateral abdominal wall with increased FDG activity, likely related to prior biopsy. Incidental CT findings: Moderate calcified plaque of the thoracic aorta. Left parapelvic cysts, no specific follow-up imaging is recommended. Severe diverticulosis. SKELETON: No focal hypermetabolic activity to suggest skeletal metastasis.x Incidental CT findings: None. IMPRESSION: 1. Numerous hypermetabolic subcapsular liver and peritoneal implants with associated small volume abdominal ascites and peritoneal thickening, findings are compatible with peritoneal carcinomatosis. 2. No evidence of metastatic disease in the chest. 3. Trace left pleural effusion. 4. Aortic Atherosclerosis (ICD10-I70.0). Electronically Signed   By: Yetta Glassman M.D.   On: 04/15/2022 09:42    PERFORMANCE STATUS (ECOG) : 1 - Symptomatic but completely ambulatory  Review of Systems Unless otherwise noted, a complete review of systems is negative.  Physical Exam General: NAD Pulmonary: Unlabored Extremities: no edema, no joint deformities Skin: no rashes Neurological: Weakness but otherwise nonfocal  IMPRESSION: I met with patient and daughter today.  Together, we reviewed the work-up as well as the discussions had previously by medical and GYN oncology.  Patient says that she made up her mind prior to the visit today that she was not interested in pursuing chemotherapy.  However, patient's daughter says that they want to continue conversations regarding goals.  Patient  recognizes that without treatment her cancer will progress and will likely be the cause of her eventual passing.  We discussed the possible future involvement of hospice.  At baseline, patient is fairly active, walking half mile a day.  She is able to provide her own self-care.  She also takes care of her husband.  Patient reports that she has good social support from her daughter and grandchildren.  She is also involved in her church.  At this point, patient is not significantly symptomatic other than constipation and poor oral intake.  We discussed trial of MiraLAX and senna.  We also discussed importance of high-calorie/high-protein foods and adhering to twice daily oral nutritional supplements.  Patient was in agreement with community palliative care following at home.  Patient reports that she has  previously completed ACP documents although these are not on file.  She was not interested in resuscitation or aggressive care at end-of-life.  Both patient and daughter were in agreement with DNR/DNI.  PLAN: -Best supportive care -Referral to community palliative care -DNR/DNI -Follow-up telephone visit 1 month  Addendum: Spoke with Christin Gusler, NP who talked with patient and daughter and both were in agreement with hospice involvement.  We will send hospice referral.  Patient expressed understanding and was in agreement with this plan. She also understands that She can call the clinic at any time with any questions, concerns, or complaints.     Time Total: 30 minutes  Visit consisted of counseling and education dealing with the complex and emotionally intense issues of symptom management and palliative care in the setting of serious and potentially life-threatening illness.Greater than 50%  of this time was spent counseling and coordinating care related to the above assessment and plan.  Signed by: Altha Harm, PhD, NP-C

## 2022-05-04 NOTE — Progress Notes (Signed)
Patient presents to clinic today for palliative care discussion. Pt has not made any decisions at this time on whether or not she would like to proceed with chemotherapy. She is having reservations about potential side effects of chemotherapy.  Patient has a living will/HPOC; however the patient daughter has not brought in the copy of the living will. She will either send the living will via mychart or drop of the form at the next visit to be scanned into the chart.  Patient reports drenching night sweats 3-4 times a week. Patient is currently on Synthroid, but has not had her tsh check since Sept (managed by pcp).  Pt reports intermittent constipation. She is using stool softeners.

## 2022-05-05 ENCOUNTER — Encounter: Payer: Self-pay | Admitting: Nurse Practitioner

## 2022-05-05 ENCOUNTER — Telehealth: Payer: No Typology Code available for payment source | Admitting: Nurse Practitioner

## 2022-05-05 ENCOUNTER — Telehealth: Payer: Self-pay

## 2022-05-05 DIAGNOSIS — Z7189 Other specified counseling: Secondary | ICD-10-CM

## 2022-05-05 DIAGNOSIS — Z515 Encounter for palliative care: Secondary | ICD-10-CM

## 2022-05-05 DIAGNOSIS — C569 Malignant neoplasm of unspecified ovary: Secondary | ICD-10-CM

## 2022-05-05 NOTE — Telephone Encounter (Signed)
Spoke with patient and scheduled a Mychart Palliative Consult for 05/05/22 @ 2PM.  Consent obtained; updated Netsmart, Team List and Epic.

## 2022-05-05 NOTE — Progress Notes (Signed)
[facility] Consult Note Telephone: [phone]  Fax: [phone]   Date of encounter: 05/05/22 7:20 PM PATIENT NAME: Erin Cook [address] [place] [address]   [phone] (home)  DOB: 1936-02-06 MRN: [id number] PRIMARY CARE PROVIDER:    Leone Haven, MD,  [address] [place] [address] [phone]  REFERRING PROVIDER:   Leone Haven, MD [address] [place],  [place] [address] [phone]  RESPONSIBLE PARTY:    Contact Information     Name Relation Home Work Mobile   Kacey, Dysert [phone]     Meta Hatchet   [phone]       Due to the COVID-19 crisis, this visit was done via telemedicine from my office and it was initiated and consent by this patient and or family.  I connected with  Erin Cook with [facility] on 05/05/22 by telephone as video not available enabled telemedicine application and verified that I am speaking with the correct person using two identifiers.   I discussed the limitations of evaluation and management by telemedicine. The patient expressed understanding and agreed to proceed. Palliative Care was asked to follow this patient by consultation request of  Leone Haven, MD to address advance care planning and complex medical decision making. This is the initial visit.                     ASSESSMENT AND PLAN / RECOMMENDATIONS:  Symptom Management/Plan: 1. Advance Care Planning;  Wishes are to proceed with Hospice services as discussed at length about dx of cancer, declined treatment. We reviewed hospice services under medicare benefit and Erin and Erin Hsia wish to proceed with hospice evaluation.  2. Goals of Care: Goals include to maximize quality of life and symptom management. Our advance care planning conversation included a discussion about:    The value and importance of advance care planning   Exploration of personal, cultural or spiritual beliefs that might influence medical decisions  Exploration of goals of care in the event of a sudden injury or illness  Identification and preparation of a healthcare agent  Review and updating or creation of an advance directive document. 3. Palliative care encounter; Palliative care encounter; Palliative medicine team will continue to support patient, patient's family, and medical team. Visit consisted of counseling and education dealing with the complex and emotionally intense issues of symptom management and palliative care in the setting of serious and potentially life-threatening illness I spent 31 minutes providing this consultation. More than 50% of the time in this consultation was spent in counseling and care coordination. PPS: 60% Chief Complaint: Initial palliative consult for complex medical decision making, address goals, manage ongoing symptoms  HISTORY OF PRESENT ILLNESS:  Erin Cook is a 86 y.o. 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  Elevated CA125 of 482.  PET/CT revealed numerous hypermetabolic subcapsular liver and peritoneal implants.  Underwent CT-guided biopsy on 04/01/2022 positive for metastatic adenocarcinoma favoring high-grade serous carcinoma of GYN origin.  Seen by GYN/medical Oncology with discussion for chemotherapy and possible future debulking surgery which Erin Cook then decided no further treatments and goal is comfort care. I called Erin Cook for initial PC visit by telephone as video was not available. We talked about past medical history, family/social history. Last Oncology appointment with cancer dx, options for treatment. Erin  Cook endorses her wishes are not to pursue further treatment and focus on quality of life with comfort care, minimize suffering. We talked about Medicare benefit under hospice services with wishes to  proceed with hospice evaluation. We talked about ros currently asymptomatic. Support provided. Notified J.Borders NP will send hospice order for evaluation. Questions answered.   History obtained from review of EMR, discussion with Erin and  Erin. Cook.  I reviewed available labs, medications, imaging, studies and related documents from the EMR.  Records reviewed and summarized above.   ROS 10 point system reviewed all negative except HPI  Physical Exam: deferred CURRENT PROBLEM LIST:  Patient Active Problem List   Diagnosis Date Noted   Primary high grade serous adenocarcinoma of ovary ([place]) 04/27/2022   Peritoneal carcinomatosis ([place]) 03/25/2022   Goals of care, counseling/discussion 03/25/2022   Abdominal pain 03/21/2022   Urine frequency 03/21/2022   Skin tear of forearm without complication 16/04/9603   Bilateral lower extremity edema 03/21/2022   Weight loss 09/17/2021   Bruising 09/17/2021   OAB (overactive bladder) 09/17/2021   History of shingles 04/12/2021   Back strain 04/10/2020   Disorder of bursae of shoulder region 04/01/2020   Sprain of shoulder and upper arm 04/01/2020   Trochanteric bursitis 12/10/2019   Varicose veins of both lower extremities 12/10/2019   Acquired overriding toe 12/10/2019   Carpal tunnel syndrome 04/10/2018   Full thickness rotator cuff tear 04/10/2018   Ulnar nerve palsy 04/10/2018   Hemangioma of skin 04/04/2018   Benign nevus 12/30/2016   Hypothyroidism, postop 09/17/2015   Lymphedema 09/17/2015   Barrett esophagus 02/10/2015   History of breast cancer 02/10/2015   Arthritis, degenerative 02/10/2015   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   Hypercholesteremia 02/10/2015   Diabetes mellitus, type 2 ([place]) 02/10/2015   PAST MEDICAL HISTORY:  Active Ambulatory Problems    Diagnosis Date Noted   Barrett esophagus 02/10/2015   History of breast cancer 02/10/2015   Arthritis, degenerative 02/10/2015   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    Hypercholesteremia 02/10/2015   Diabetes mellitus, type 2 ([place]) 02/10/2015   Hypothyroidism, postop 09/17/2015   Lymphedema 09/17/2015   Benign nevus 12/30/2016   Hemangioma of skin 04/04/2018   Trochanteric bursitis 12/10/2019   Varicose veins of both lower extremities 12/10/2019   Acquired overriding toe 12/10/2019   Carpal tunnel syndrome 04/10/2018   Disorder of bursae of shoulder region 04/01/2020   Full thickness rotator cuff tear 04/10/2018   Sprain of shoulder and upper arm 04/01/2020   Ulnar nerve palsy 04/10/2018   Back strain 04/10/2020   History of shingles 04/12/2021   Weight loss 09/17/2021   Bruising 09/17/2021   OAB (overactive bladder) 09/17/2021   Abdominal pain 03/21/2022   Urine frequency 03/21/2022   Skin tear of forearm without complication 54/03/8118   Bilateral lower extremity edema 03/21/2022   Peritoneal carcinomatosis ([place]) 03/25/2022   Goals of care, counseling/discussion 03/25/2022   Primary high grade serous adenocarcinoma of ovary ([place]) 04/27/2022   Resolved Ambulatory Problems    Diagnosis Date Noted   Acid reflux 02/10/2015   Burning or prickling sensation 02/10/2015   Subclinical hypothyroidism 02/10/2015   Diabetes mellitus without complication ([place]) 14/78/2956   Grief 04/04/2017   Past Medical History:  Diagnosis Date   Arthritis    Breast cancer ([place]) 1976   Car sickness    GERD (gastroesophageal reflux disease)    HOH (hard of hearing)    Hyperlipidemia    Hypothyroidism    SOCIAL HX:  Social History   Tobacco Use   Smoking status: Never   Smokeless tobacco: Never  Substance Use Topics   Alcohol use: No   FAMILY HX:  Family History  Problem Relation Age of Onset   Stroke Mother    Heart disease Mother    Heart disease Father    Stroke Father       ALLERGIES: No Known Allergies   PERTINENT MEDICATIONS:  Outpatient Encounter Medications as of 05/05/2022  Medication Sig   Ascorbic Acid (VITAMIN C) 1000 MG tablet Take  1,000 mg by mouth daily. (Patient not taking: Reported on 05/04/2022)   blood glucose meter kit and supplies KIT Dispense based on patient and insurance preference. One Touch Verio Flex Meter  [phone]). Use once daily. Dx E11.9.   dexamethasone (DECADRON) 4 MG tablet Take 2 tablets (8 mg total) by mouth daily for 2 days. Start the day after chemotherapy. Take with food. (Patient not taking: Reported on 04/25/2022)   Docusate Calcium (STOOL SOFTENER PO) Take 1 capsule by mouth daily.   glucose blood test strip Use three times daily. One touch verio strips.   levothyroxine (SYNTHROID) 25 MCG tablet TAKE 2&1/2 TABLETS BY MOUTH DAILY WITH BREAKFAST   metFORMIN (GLUCOPHAGE) 500 MG tablet Take 2 tablets (1,000 mg total) by mouth 2 (two) times daily with a meal.   ondansetron (ZOFRAN) 8 MG tablet Take 1 tablet (8 mg total) by mouth every 8 (eight) hours as needed for nausea or vomiting. Start on the third day after chemotherapy. (Patient not taking: Reported on 80/09/4915)   OneTouch Delica Lancets 91T MISC Used to check blood sugar one time a day.   prochlorperazine (COMPAZINE) 10 MG tablet Take 1 tablet (10 mg total) by mouth every 6 (six) hours as needed for nausea or vomiting. (Patient not taking: Reported on 04/25/2022)   rosuvastatin (CRESTOR) 20 MG tablet Take 1 tablet (20 mg total) by mouth daily.   vitamin B-12 (CYANOCOBALAMIN) 1000 MCG tablet Take 1,000 mcg by mouth daily.   No facility-administered encounter medications on file as of 05/05/2022.   Thank you for the opportunity to participate in the care of Erin. Heffley.  The palliative care team will continue to follow. Please call our office at [phone] if we can be of additional assistance.   Annalina Needles Z Shyia Fillingim, NP ,

## 2022-05-05 NOTE — Addendum Note (Signed)
Addended by: Irean Hong on: 05/05/2022 02:48 PM   Modules accepted: Orders

## 2022-05-06 ENCOUNTER — Telehealth: Payer: Self-pay

## 2022-05-06 NOTE — Telephone Encounter (Signed)
Noted. I called the number though it seemed I got a call center. I gave my information and a call back number. They noted someone would reach out to me on this. Please call on Monday to make sure this is resolved. I am willing to be the physician of record though I would like for the hospice physician to manage comfort measures.

## 2022-05-06 NOTE — Telephone Encounter (Signed)
Erin Cook from [facility] stated they received the referral from Dr. Caryl Bis and   The family would like to know if Dr. Caryl Bis would be the attending physician on the record.  Erin Cook states this is a time sensitive matter.  Erin Cook thought she left the msg at the correct facility on yesterday, she stated she spoke to a tara but I explained to her we do not have a tara in this office.    Erin Cook would like to be reached at [phone]

## 2022-05-09 NOTE — Telephone Encounter (Signed)
I called and spoke with Erin Cook and informed her that the provider would be the patients attending but the hospice provider could do the comfort measures and she understood.  Erin Cook,cma

## 2022-05-16 ENCOUNTER — Ambulatory Visit: Payer: No Typology Code available for payment source | Admitting: Oncology

## 2022-05-16 ENCOUNTER — Other Ambulatory Visit: Payer: No Typology Code available for payment source

## 2022-05-16 ENCOUNTER — Ambulatory Visit: Payer: No Typology Code available for payment source

## 2022-05-19 ENCOUNTER — Encounter: Payer: No Typology Code available for payment source | Admitting: Licensed Clinical Social Worker

## 2022-05-19 ENCOUNTER — Other Ambulatory Visit: Payer: No Typology Code available for payment source

## 2022-06-01 ENCOUNTER — Telehealth: Payer: Self-pay | Admitting: *Deleted

## 2022-06-01 NOTE — Telephone Encounter (Signed)
-----   Message from Stephens November sent at 06/01/2022  8:51 AM EST ----- Regarding: Telephone Visit Good Morning, Pt called in this am to communicate that she would like to be contacted on her home phone for today's telephone visit. Appointment notes have been updated.  Pt also wanted to make sure that the call is being billed as A Hospice Medicare Benefit and not A [facility] Bill. Please advise on billing, she would like to know before call today.

## 2022-06-01 NOTE — Telephone Encounter (Signed)
Pt's apt is on 12/1. I msg the scheduling team to clarify if pt wants the apt today.

## 2022-06-01 NOTE — Telephone Encounter (Signed)
I spoke with hospice nurse, Blanket.  Patient is doing reasonably well and it does not sound like she needs a telephone visit with [place] at this time.  He plans to see her tomorrow and will communicate with her and let us know if follow-up is required.

## 2022-06-01 NOTE — Telephone Encounter (Signed)
Per scheduling- pt has called to cancel appt and does not wish to r/s per cancel reason.

## 2022-06-03 ENCOUNTER — Inpatient Hospital Stay: Payer: No Typology Code available for payment source | Admitting: Hospice and Palliative Medicine

## 2022-09-06 ENCOUNTER — Encounter: Payer: Self-pay | Admitting: Family Medicine

## 2022-09-06 ENCOUNTER — Other Ambulatory Visit: Payer: Self-pay

## 2022-09-06 DIAGNOSIS — E039 Hypothyroidism, unspecified: Secondary | ICD-10-CM

## 2022-09-06 MED ORDER — LEVOTHYROXINE SODIUM 25 MCG PO TABS: ORAL_TABLET | ORAL | 1 refills | Status: DC

## 2022-09-06 NOTE — Telephone Encounter (Signed)
Pt advised per [facility] that last script was filled for #225 tablets or 90 supply. Insurance will not pay for rx to be refilled until 09/30/2021. Trying to fill too early.  Pt understood, stated will fill at the end of the month.

## 2022-10-03 DEATH — deceased

## 2022-10-04 ENCOUNTER — Telehealth: Payer: Self-pay | Admitting: Family Medicine

## 2022-10-04 NOTE — Telephone Encounter (Signed)
Caller Name: Erin Cook and [facility] home Phone #:   [phone]  Date of death: 10-20-2022 Place of death: At her residence  Time of death: 5:48am            (Please indicate AM/PM)    Autopsy performed: unknown Funeral Home: [facility]  Case # [id number]  Electric signature need to sign.

## 2022-10-05 NOTE — Telephone Encounter (Signed)
Completed.

## 2022-12-22 ENCOUNTER — Ambulatory Visit: Payer: No Typology Code available for payment source
